# Patient Record
Sex: Male | Born: 1965 | Race: Black or African American | Hispanic: No | Marital: Single | Smoking: Current every day smoker
Health system: Southern US, Community
[De-identification: ages and names within clinical notes are randomized; demographics above are authoritative.]

## PROBLEM LIST (undated history)

## (undated) DIAGNOSIS — W3400XA Accidental discharge from unspecified firearms or gun, initial encounter: Secondary | ICD-10-CM

## (undated) DIAGNOSIS — K7400 Hepatic fibrosis, unspecified: Secondary | ICD-10-CM

## (undated) DIAGNOSIS — M1990 Unspecified osteoarthritis, unspecified site: Secondary | ICD-10-CM

## (undated) DIAGNOSIS — F32A Depression, unspecified: Secondary | ICD-10-CM

## (undated) DIAGNOSIS — B1920 Unspecified viral hepatitis C without hepatic coma: Secondary | ICD-10-CM

## (undated) DIAGNOSIS — D649 Anemia, unspecified: Secondary | ICD-10-CM

## (undated) HISTORY — DX: Hepatic fibrosis, unspecified: K74.00

## (undated) HISTORY — DX: Anemia, unspecified: D64.9

## (undated) HISTORY — DX: Depression, unspecified: F32.A

## (undated) HISTORY — DX: Unspecified viral hepatitis C without hepatic coma: B19.20

---
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
## 2001-12-13 HISTORY — PX: WISDOM TOOTH EXTRACTION: SHX21

## 2002-12-13 DIAGNOSIS — W3400XA Accidental discharge from unspecified firearms or gun, initial encounter: Secondary | ICD-10-CM

## 2002-12-13 DIAGNOSIS — Z5189 Encounter for other specified aftercare: Secondary | ICD-10-CM

## 2002-12-13 HISTORY — DX: Encounter for other specified aftercare: Z51.89

## 2002-12-13 HISTORY — DX: Accidental discharge from unspecified firearms or gun, initial encounter: W34.00XA

## 2002-12-13 HISTORY — PX: LEG SURGERY: SHX1003

## 2003-08-22 ENCOUNTER — Encounter: Admission: RE | Admit: 2003-08-22 | Discharge: 2003-11-20 | Payer: Self-pay

## 2003-08-23 ENCOUNTER — Encounter: Payer: Self-pay | Admitting: Emergency Medicine

## 2003-08-23 ENCOUNTER — Emergency Department (HOSPITAL_COMMUNITY): Admission: EM | Admit: 2003-08-23 | Discharge: 2003-08-23 | Payer: Self-pay | Admitting: Emergency Medicine

## 2005-06-15 ENCOUNTER — Emergency Department (HOSPITAL_COMMUNITY): Admission: EM | Admit: 2005-06-15 | Discharge: 2005-06-15 | Payer: Self-pay | Admitting: Emergency Medicine

## 2015-03-18 ENCOUNTER — Encounter (HOSPITAL_COMMUNITY): Payer: Self-pay | Admitting: Emergency Medicine

## 2015-03-18 ENCOUNTER — Emergency Department (HOSPITAL_COMMUNITY)
Admission: EM | Admit: 2015-03-18 | Discharge: 2015-03-18 | Disposition: A | Discharge status: Home / Self Care | Payer: Self-pay | Attending: Emergency Medicine | Admitting: Emergency Medicine

## 2015-03-18 DIAGNOSIS — Z720 Tobacco use: Secondary | ICD-10-CM | POA: Insufficient documentation

## 2015-03-18 DIAGNOSIS — Z880 Allergy status to penicillin: Secondary | ICD-10-CM | POA: Insufficient documentation

## 2015-03-18 DIAGNOSIS — M65051 Abscess of tendon sheath, right thigh: Secondary | ICD-10-CM | POA: Insufficient documentation

## 2015-03-18 DIAGNOSIS — L02415 Cutaneous abscess of right lower limb: Secondary | ICD-10-CM

## 2015-03-18 MED ORDER — SULFAMETHOXAZOLE-TRIMETHOPRIM 800-160 MG PO TABS: 1.0000 | ORAL_TABLET | Freq: Two times a day (BID) | ORAL | Status: AC

## 2015-03-18 NOTE — ED Provider Notes (Signed)
CSN: [id number]     Arrival date & time 03/18/15  0939 History  This chart was scribed for non-physician practitioner, Lucien Mons, PA-C working with [place], DO by Frederich Balding, ED scribe. This patient was seen in room TR11C/TR11C and the patient's care was started at 10:01 AM.   Chief Complaint  Patient presents with  . Abscess   The history is provided by the patient. No language interpreter was used.    HPI Comments: Henry Cunningham is a 49 y.o. male who presents to the Emergency Department complaining of an abscess to his right hip that started 2 weeks ago. He has used warm compresses and states it started draining 6 days ago. Pt reports increased pain around the area with pressure. He denies fever.   History reviewed. No pertinent past medical history. History reviewed. No pertinent past surgical history. No family history on file. History  Substance Use Topics  . Smoking status: Current Every Day Smoker -- 0.50 packs/day    Types: Cigarettes  . Smokeless tobacco: Not on file  . Alcohol Use: Yes     Comment: socially    Review of Systems  Constitutional: Negative for fever.  Skin: Positive for wound.  All other systems reviewed and are negative.  Allergies  Penicillins  Home Medications   Prior to Admission medications   Medication Sig Start Date End Date Taking? Authorizing Provider  sulfamethoxazole-trimethoprim (BACTRIM DS,SEPTRA DS) 800-160 MG per tablet Take 1 tablet by mouth 2 (two) times daily. 03/18/15 03/25/15  Celenia Hruska M Tag Wurtz, PA-C   BP 129/90 mmHg  Pulse 71  Temp(Src) 97.9 F (36.6 C) (Oral)  Resp 18  Ht 5\' 9"  (1.753 m)  Wt 189 lb (85.73 kg)  BMI 27.90 kg/m2  SpO2 97%   Physical Exam  Constitutional: He is oriented to person, place, and time. He appears well-developed and well-nourished. No distress.  HENT:  Head: Normocephalic and atraumatic.  Eyes: Conjunctivae and EOM are normal.  Neck: Normal range of motion. Neck supple.  Cardiovascular:  Normal rate, regular rhythm and normal heart sounds.   Pulmonary/Chest: Effort normal and breath sounds normal.  Musculoskeletal: Normal range of motion. He exhibits no edema.       Legs: Neurological: He is alert and oriented to person, place, and time.  Skin: Skin is warm and dry.  Psychiatric: He has a normal mood and affect. His behavior is normal.  Nursing note and vitals reviewed.   ED Course  Procedures (including critical care time)  DIAGNOSTIC STUDIES: Oxygen Saturation is 97% on RA, normal by my interpretation.    COORDINATION OF CARE: 10:02 AM-Discussed treatment plan which includes oral and topical antibitoics with pt at bedside and pt agreed to plan. Advised pt to continue warm compresses and follow up with primary care. Return precautions given.   Labs Review Labs Reviewed - No data to display  Imaging Review No results found.   EKG Interpretation None      MDM   Final diagnoses:  Abscess of right thigh   Nontoxic appearing, NAD. VSS. Abscess appears infected, very open. Advised continued warm compresses. Will start pt on Bactrim as this appears secondarily infected. Resources given for PCP establishment. Advised follow-up in urgent care in 2 days. Stable for discharge. Return precautions given. Patient states understanding of treatment care plan and is agreeable.   I personally performed the services described in this documentation, which was scribed in my presence. The recorded information has been reviewed and is  accurate.  Carman Ching, PA-C 03/18/15 [place], DO 03/18/15 1036

## 2015-03-18 NOTE — ED Notes (Signed)
Patient states he had a large "blister" come up about 2 weeks ago.  Patient states it "popped 1 week ago with a lot of pus".  Patient states still draining and now open.

## 2015-03-18 NOTE — Discharge Instructions (Signed)
Take Bactrim twice daily for 1 week. It is important for you to complete the entire course of this antibiotic. Apply warm compresses intermittently throughout the day. Follow-up with the wellness clinic or urgent care center in 2 days for wound recheck.  Abscess An abscess is an infected area that contains a collection of pus and debris.It can occur in almost any part of the body. An abscess is also known as a furuncle or boil. CAUSES  An abscess occurs when tissue gets infected. This can occur from blockage of oil or sweat glands, infection of hair follicles, or a minor injury to the skin. As the body tries to fight the infection, pus collects in the area and creates pressure under the skin. This pressure causes pain. People with weakened immune systems have difficulty fighting infections and get certain abscesses more often.  SYMPTOMS Usually an abscess develops on the skin and becomes a painful mass that is red, warm, and tender. If the abscess forms under the skin, you may feel a moveable soft area under the skin. Some abscesses break open (rupture) on their own, but most will continue to get worse without care. The infection can spread deeper into the body and eventually into the bloodstream, causing you to feel ill.  DIAGNOSIS  Your caregiver will take your medical history and perform a physical exam. A sample of fluid may also be taken from the abscess to determine what is causing your infection. TREATMENT  Your caregiver may prescribe antibiotic medicines to fight the infection. However, taking antibiotics alone usually does not cure an abscess. Your caregiver may need to make a small cut (incision) in the abscess to drain the pus. In some cases, gauze is packed into the abscess to reduce pain and to continue draining the area. HOME CARE INSTRUCTIONS   Only take over-the-counter or prescription medicines for pain, discomfort, or fever as directed by your caregiver.  If you were prescribed  antibiotics, take them as directed. Finish them even if you start to feel better.  If gauze is used, follow your caregiver's directions for changing the gauze.  To avoid spreading the infection:  Keep your draining abscess covered with a bandage.  Wash your hands well.  Do not share personal care items, towels, or whirlpools with others.  Avoid skin contact with others.  Keep your skin and clothes clean around the abscess.  Keep all follow-up appointments as directed by your caregiver. SEEK MEDICAL CARE IF:   You have increased pain, swelling, redness, fluid drainage, or bleeding.  You have muscle aches, chills, or a general ill feeling.  You have a fever. MAKE SURE YOU:   Understand these instructions.  Will watch your condition.  Will get help right away if you are not doing well or get worse. Document Released: 09/08/2005 Document Revised: 05/30/2012 Document Reviewed: 02/11/2012 [facility] Patient Information 2015 [place], [place]. This information is not intended to replace advice given to you by your health care provider. Make sure you discuss any questions you have with your health care provider.

## 2016-03-28 ENCOUNTER — Encounter (HOSPITAL_COMMUNITY): Payer: Self-pay | Admitting: *Deleted

## 2016-03-28 ENCOUNTER — Emergency Department (HOSPITAL_COMMUNITY)
Admission: EM | Admit: 2016-03-28 | Discharge: 2016-03-28 | Disposition: A | Discharge status: Home / Self Care | Payer: Self-pay | Attending: Emergency Medicine | Admitting: Emergency Medicine

## 2016-03-28 DIAGNOSIS — Z8739 Personal history of other diseases of the musculoskeletal system and connective tissue: Secondary | ICD-10-CM | POA: Insufficient documentation

## 2016-03-28 DIAGNOSIS — L0291 Cutaneous abscess, unspecified: Secondary | ICD-10-CM

## 2016-03-28 DIAGNOSIS — Z880 Allergy status to penicillin: Secondary | ICD-10-CM | POA: Insufficient documentation

## 2016-03-28 DIAGNOSIS — F17210 Nicotine dependence, cigarettes, uncomplicated: Secondary | ICD-10-CM | POA: Insufficient documentation

## 2016-03-28 DIAGNOSIS — Z87828 Personal history of other (healed) physical injury and trauma: Secondary | ICD-10-CM | POA: Insufficient documentation

## 2016-03-28 DIAGNOSIS — L02414 Cutaneous abscess of left upper limb: Secondary | ICD-10-CM | POA: Insufficient documentation

## 2016-03-28 HISTORY — DX: Unspecified osteoarthritis, unspecified site: M19.90

## 2016-03-28 HISTORY — DX: Accidental discharge from unspecified firearms or gun, initial encounter: W34.00XA

## 2016-03-28 MED ORDER — SULFAMETHOXAZOLE-TRIMETHOPRIM 800-160 MG PO TABS: 1.0000 | ORAL_TABLET | Freq: Two times a day (BID) | ORAL | Status: AC

## 2016-03-28 NOTE — ED Notes (Signed)
PT has a 3cm crusty wound with drainage to posterior aspect of Lt arm near elbow.

## 2016-03-28 NOTE — Discharge Instructions (Signed)

## 2016-03-28 NOTE — ED Notes (Signed)
Declined W/C at D/C and was escorted to lobby by RN. 
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
## 2017-04-27 ENCOUNTER — Encounter: Payer: Self-pay | Admitting: Internal Medicine

## 2017-06-01 ENCOUNTER — Encounter: Payer: Self-pay | Admitting: Internal Medicine

## 2017-06-01 ENCOUNTER — Ambulatory Visit (INDEPENDENT_AMBULATORY_CARE_PROVIDER_SITE_OTHER): Payer: Self-pay | Admitting: Internal Medicine

## 2017-06-01 ENCOUNTER — Telehealth: Payer: Self-pay | Admitting: *Deleted

## 2017-06-01 ENCOUNTER — Ambulatory Visit (INDEPENDENT_AMBULATORY_CARE_PROVIDER_SITE_OTHER): Payer: Self-pay | Admitting: Licensed Clinical Social Worker

## 2017-06-01 VITALS — BP 117/79 | HR 66 | Temp 97.6°F | Ht 68.5 in | Wt 165.0 lb

## 2017-06-01 DIAGNOSIS — Z23 Encounter for immunization: Secondary | ICD-10-CM

## 2017-06-01 DIAGNOSIS — F321 Major depressive disorder, single episode, moderate: Secondary | ICD-10-CM

## 2017-06-01 DIAGNOSIS — B182 Chronic viral hepatitis C: Secondary | ICD-10-CM | POA: Insufficient documentation

## 2017-06-01 LAB — CBC WITH DIFFERENTIAL/PLATELET
Basophils Absolute: 0 cells/uL (ref 0–200)
Basophils Relative: 0 %
EOS ABS: 200 {cells}/uL (ref 15–500)
Eosinophils Relative: 4 %
HEMATOCRIT: 46.9 % (ref 38.5–50.0)
HEMOGLOBIN: 15.3 g/dL (ref 13.2–17.1)
LYMPHS ABS: 1500 {cells}/uL (ref 850–3900)
Lymphocytes Relative: 30 %
MCH: 30.8 pg (ref 27.0–33.0)
MCHC: 32.6 g/dL (ref 32.0–36.0)
MCV: 94.4 fL (ref 80.0–100.0)
MONO ABS: 300 {cells}/uL (ref 200–950)
MPV: 11.1 fL (ref 7.5–12.5)
Monocytes Relative: 6 %
NEUTROS PCT: 60 %
Neutro Abs: 3000 cells/uL (ref 1500–7800)
Platelets: 199 10*3/uL (ref 140–400)
RBC: 4.97 MIL/uL (ref 4.20–5.80)
RDW: 13.5 % (ref 11.0–15.0)
WBC: 5 10*3/uL (ref 3.8–10.8)

## 2017-06-01 LAB — HEPATITIS B CORE ANTIBODY, TOTAL: Hep B Core Total Ab: NONREACTIVE

## 2017-06-01 LAB — COMPLETE METABOLIC PANEL WITH GFR
ALBUMIN: 4 g/dL (ref 3.6–5.1)
ALT: 40 U/L (ref 9–46)
AST: 32 U/L (ref 10–35)
Alkaline Phosphatase: 88 U/L (ref 40–115)
BUN: 13 mg/dL (ref 7–25)
CHLORIDE: 105 mmol/L (ref 98–110)
CO2: 27 mmol/L (ref 20–31)
Calcium: 8.9 mg/dL (ref 8.6–10.3)
Creat: 1.16 mg/dL (ref 0.70–1.33)
GFR, EST AFRICAN AMERICAN: 84 mL/min (ref >=60)
GFR, EST NON AFRICAN AMERICAN: 73 mL/min (ref >=60)
GLUCOSE: 87 mg/dL (ref 65–99)
Potassium: 4 mmol/L (ref 3.5–5.3)
Sodium: 139 mmol/L (ref 135–146)
TOTAL PROTEIN: 7.4 g/dL (ref 6.1–8.1)
Total Bilirubin: 0.6 mg/dL (ref 0.2–1.2)

## 2017-06-01 LAB — PROTIME-INR
INR: 1
Prothrombin Time: 11.1 s (ref 9.0–11.5)

## 2017-06-01 LAB — HEPATITIS B SURFACE ANTIGEN: Hepatitis B Surface Ag: NEGATIVE

## 2017-06-01 LAB — HEPATITIS B SURFACE ANTIBODY,QUALITATIVE: Hep B S Ab: NEGATIVE

## 2017-06-01 LAB — HEPATITIS A ANTIBODY, TOTAL: HEP A TOTAL AB: NONREACTIVE

## 2017-06-01 LAB — HIV ANTIBODY (ROUTINE TESTING W REFLEX): HIV 1&2 Ab, 4th Generation: NONREACTIVE

## 2017-06-01 MED ORDER — SOFOSBUVIR-VELPATASVIR 400-100 MG PO TABS: 1.0000 | ORAL_TABLET | Freq: Every day | ORAL | 2 refills | Status: DC

## 2017-06-01 NOTE — Addendum Note (Signed)
Addended by: Myrtis Hopping A on: 06/01/2017 10:26 AM   Modules accepted: Orders

## 2017-06-01 NOTE — Patient Instructions (Signed)
Date 06/01/17  Dear Mr. Tankard, As discussed in the [facility], your hepatitis C therapy will include highly effective medication(s) for treatment and will vary based on the type of hepatitis C and insurance approval.  Potential medications include:          Harvoni (sofosbuvir 90mg /ledipasvir 400mg ) tablet oral daily          OR     Epclusa (sofosbuvir 400mg /velpatasvir 100mg ) tablet oral daily          OR      Mavyret (glecaprevir 100 mg/pibrentasvir 40 mg): Take 3 tablets oral daily              Medications are typically for 8 or 12 weeks total ---------------------------------------------------------------- Your HCV Treatment Start Date: You will be notified by our office once the medication is approved and where you can pick it up (or if mailed)   ---------------------------------------------------------------- [place]:   [facility] [place], [place] [address] Phone: [phone] Hours: Monday to Friday 7:30 am to 6:00 pm   Please always contact your pharmacy at least 3-4 business days before you run out of medications to ensure your next month's medication is ready or 1 week prior to running out if you receive it by mail.  Remember, each prescription is for 28 days. ---------------------------------------------------------------- GENERAL NOTES REGARDING YOUR HEPATITIS C MEDICATION:  Some medications have the following interactions:  - Acid reducing agents such as H2 blockers (ie. Pepcid (famotidine), Zantac (ranitidine), Tagamet (cimetidine), Axid (nizatidine) and proton pump inhibitors (ie. Prilosec (omeprazole), Protonix (pantoprazole), Nexium (esomeprazole), or Aciphex (rabeprazole)). Do not take until you have discussed with a health care provider.    -Antacids that contain magnesium and/or aluminum hydroxide (ie. Milk of Magensia, Rolaids, Gaviscon, Maalox, Mylanta, an dArthritis Pain Formula).  -Calcium carbonate (calcium  supplements or antacids such as Tums, Caltrate, Os-Cal).  -St. John's wort or any products that contain St. John's wort like some herbal supplements  Please inform the office prior to starting any of these medications.  - The common side effects associated with Harvoni include:      1. Fatigue      2. Headache      3. Nausea      4. Diarrhea      5. Insomnia  Please note that this only lists the most common side effects and is NOT a comprehensive list of the potential side effects of these medications. For more information, please review the drug information sheets that come with your medication package from the pharmacy.  ---------------------------------------------------------------- GENERAL HELPFUL HINTS ON HCV THERAPY: 1. Stay well-hydrated. 2. Notify the [facility] of any changes in your other over-the-counter/herbal or prescription medications. 3. If you miss a dose of your medication, take the missed dose as soon as you remember. Return to your regular time/dose schedule the next day.  4.  Do not stop taking your medications without first talking with your healthcare provider. 5.  You may take Tylenol (acetaminophen), as long as the dose is less than 2000 mg (OR no more than 4 tablets of the Tylenol Extra Strengths 500mg  tablet) in 24 hours. 6.  You will see our pharmacist-specialist within the first 2 weeks of starting your medication to monitor for any possible side effects. 7.  You will have labs once during treatment, soon after treatment completion and one final lab 6 months after treatment completion to verify the virus is out of your system.  Elmer Merwin, [place],  [place] [facility] [place] [place], [place]  [id number] [phone]

## 2017-06-01 NOTE — Telephone Encounter (Signed)
error 
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
## 2017-06-01 NOTE — Progress Notes (Signed)
Integrated Behavioral Health Initial Visit  MRN: [id number] Name: Henry Cunningham   Session Start time: 10:30 am Session End time: 10:45 am Total time: 15 minutes  Type of Service: [place] Interpretor:No. Interpretor Name and Language: N/A   Warm Hand Off Completed.       SUBJECTIVE: Henry Cunningham is a 51 y.o. male accompanied by patient. Patient was referred by Dr. Linus Salmons for high PHQ-9 scores.  Patient reports the following symptoms/concerns: See PHQ-9 results below Severity of problem: moderate  OBJECTIVE: Mood: Depressed and Affect: Appropriate Risk of harm to self or others: No plan to harm self or others.  Patient stated, "I love myself too much".  Patient also denied past suicide attempt.   LIFE CONTEXT: Patient is seeking assistance with employment, housing and transportation.  GOALS ADDRESSED: Patient will reduce symptoms of: depression and increase knowledge and/or ability of: coping skills and also: Increase healthy adjustment to current life circumstances and Increase adequate support systems for patient/family   INTERVENTIONS: Supportive Counseling and Link to [employer]  Standardized Assessments completed: PHQ 9 Score is 13, which is within the range 10-14 moderately depressed  Depression screen PHQ 2/9 06/01/2017  Decreased Interest 2  Down, Depressed, Hopeless 2  PHQ - 2 Score 4  Altered sleeping 2  Tired, decreased energy 2  Change in appetite 2  Feeling bad or failure about yourself  2  Trouble concentrating 0  Moving slowly or fidgety/restless 1  Suicidal thoughts 0  PHQ-9 Score 13  Difficult doing work/chores Very difficult    ASSESSMENT: Patient currently experiencing depressive symptoms and may benefit from local mental health services.  Patient reported that he has history of receiving mental health services from [facility] but walked away from treatment.  Patient was agreeable with  returning there for treatment and stated that he would walk-in for assessment either this afternoon or tomorrow morning.  PLAN: 1. Referral(s): [employer] forces logistics/support/administrative officer (LME/Outside Clinic) and [employer]:  Food and Housing 2. Patient will return to [facility] today or tomorrow for assessment and to receive mental health services and medication management.  Henry Cunningham, [facility]

## 2017-06-02 ENCOUNTER — Telehealth: Payer: Self-pay | Admitting: *Deleted

## 2017-06-02 NOTE — Telephone Encounter (Signed)
Patient's emergency contact, mother left a voice mail to inform him of appt for ultrasound on 06/23/17 at 10:45 AM at [facility]. Nothing to eat or drink after midnight. Patient stated he is currently home less and his voice mail is not set up on his phone. Myrtis Hopping

## 2017-06-04 LAB — LIVER FIBROSIS, FIBROTEST-ACTITEST
ALPHA-2-MACROGLOBULIN: 197 mg/dL (ref 106–279)
ALT: 37 U/L (ref 9–46)
Apolipoprotein A1: 143 mg/dL (ref 94–176)
Bilirubin: 0.5 mg/dL (ref 0.2–1.2)
FIBROSIS SCORE: 0.24
GGT: 38 U/L (ref 3–95)
HAPTOGLOBIN: 145 mg/dL (ref 43–212)
Necroinflammat ACT Score: 0.18
Reference ID: 2001552

## 2017-06-06 LAB — HCV RNA, QN PCR RFLX GENO, LIPA
HCV RNA, PCR, QN: 778000 [IU]/mL — AB
HCV RNA, PCR, QN: 5.89 log IU/mL — ABNORMAL HIGH

## 2017-06-06 LAB — HEPATITIS C GENOTYPE: HCV Genotype: 3

## 2017-06-23 ENCOUNTER — Ambulatory Visit (HOSPITAL_COMMUNITY): Payer: Self-pay

## 2017-07-07 ENCOUNTER — Ambulatory Visit (INDEPENDENT_AMBULATORY_CARE_PROVIDER_SITE_OTHER): Payer: Self-pay | Admitting: Pharmacist Clinician (PhC)/ Clinical Pharmacy Specialist

## 2017-07-07 ENCOUNTER — Encounter: Payer: Self-pay | Admitting: Pharmacy Technician

## 2017-07-07 DIAGNOSIS — B182 Chronic viral hepatitis C: Secondary | ICD-10-CM
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
## 2017-07-28 ENCOUNTER — Ambulatory Visit: Payer: Self-pay

## 2017-09-02 ENCOUNTER — Encounter (HOSPITAL_COMMUNITY): Payer: Self-pay

## 2017-09-02 ENCOUNTER — Emergency Department (HOSPITAL_COMMUNITY)
Admission: EM | Admit: 2017-09-02 | Discharge: 2017-09-02 | Disposition: A | Discharge status: Home / Self Care | Payer: Self-pay | Attending: Emergency Medicine | Admitting: Emergency Medicine

## 2017-09-02 ENCOUNTER — Emergency Department (HOSPITAL_COMMUNITY): Payer: Self-pay

## 2017-09-02 DIAGNOSIS — M25552 Pain in left hip: Secondary | ICD-10-CM | POA: Insufficient documentation

## 2017-09-02 DIAGNOSIS — M25562 Pain in left knee: Secondary | ICD-10-CM | POA: Insufficient documentation

## 2017-09-02 DIAGNOSIS — F17210 Nicotine dependence, cigarettes, uncomplicated: Secondary | ICD-10-CM | POA: Insufficient documentation

## 2017-09-02 DIAGNOSIS — Z79899 Other long term (current) drug therapy: Secondary | ICD-10-CM | POA: Insufficient documentation

## 2017-09-02 MED ORDER — NAPROXEN 500 MG PO TABS: 500.0000 mg | ORAL_TABLET | Freq: Two times a day (BID) | ORAL | 0 refills | Status: DC | PRN

## 2017-09-02 MED ORDER — TRAMADOL HCL 50 MG PO TABS
50.0000 mg | ORAL_TABLET | Freq: Once | ORAL | Status: AC
  Administered 2017-09-02: 50 mg via ORAL
  Filled 2017-09-02: qty 1

## 2017-09-02 NOTE — Discharge Instructions (Signed)
Please take Naproxen (anti-inflammatory) as prescribed--take with food to prevent GI upset. Please follow up with your primary care doctor in 3-5 days regarding today's visit; also please follow up with Dr. Ninfa Linden (orthopedics) in 1 week regarding today's ER visit. Please return to the ER if you experience fevers, chills, unexplained weight loss, dizziness, chest pain, shortness of breath, abdominal pain, nausea, vomiting, diarrhea, bladder or bowel dysfunction, numbness to groin, swelling/redness/warmth to hip or knee, extremity numbness or tingling, extremity weakness, increased pain, worsening symptoms, or any additional concerns.

## 2017-09-02 NOTE — ED Triage Notes (Signed)
Pt presents to the ed for complaints of having pain in his left hip and knee that started this morning, denies any injury. Reports having really bad arthritis.
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
## 2017-09-08 ENCOUNTER — Ambulatory Visit (INDEPENDENT_AMBULATORY_CARE_PROVIDER_SITE_OTHER): Payer: Self-pay | Admitting: Pharmacist

## 2017-09-08 DIAGNOSIS — B182 Chronic viral hepatitis C: Secondary | ICD-10-CM
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
## 2017-09-10 LAB — HEPATITIS C RNA QUANTITATIVE
HCV QUANT LOG: 5.74 {Log_IU}/mL — AB
HCV RNA, PCR, QN: 554000 [IU]/mL — AB

## 2017-09-12 NOTE — Progress Notes (Signed)
He is clearly not taking it at all.

## 2017-09-12 NOTE — Progress Notes (Signed)
This guy is supposedly on his last month of Epclusa.Henry KitchenMarland Cunningham

## 2017-09-14 ENCOUNTER — Telehealth: Payer: Self-pay | Admitting: Pharmacist

## 2017-09-14 ENCOUNTER — Other Ambulatory Visit: Payer: Self-pay | Admitting: Pharmacist

## 2017-09-14 NOTE — Telephone Encounter (Signed)
Pt on Epclusa x 12 weeks for his Hep C infection.  He started around 7/26 and is on his last month.  He tells me he is adherent but when a viral load was checked, it was still 554,000 which is way high for him to be almost done with therapy.  I called him again and he said he only remembers missing 3 doses.  Will discuss with Dr. Linus Salmons.
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
## 2017-09-28 ENCOUNTER — Telehealth: Payer: Self-pay | Admitting: Pharmacist

## 2017-09-28 NOTE — Telephone Encounter (Signed)
I was finally able to get in touch with [place] concerning his Hep C.  He still states he has only missed 3-4 doses.  Dr. Linus Salmons would like for him to come and see him, so I scheduled him to come back on Tuesday October 23 at 11:15am.

## 2017-10-04 ENCOUNTER — Ambulatory Visit (INDEPENDENT_AMBULATORY_CARE_PROVIDER_SITE_OTHER): Payer: Self-pay | Admitting: Internal Medicine

## 2017-10-04 ENCOUNTER — Encounter: Payer: Self-pay | Admitting: Internal Medicine

## 2017-10-04 VITALS — BP 109/70 | HR 81 | Temp 98.4°F | Ht 68.0 in | Wt 173.0 lb

## 2017-10-04 DIAGNOSIS — B182 Chronic viral hepatitis C: Secondary | ICD-10-CM

## 2017-10-04 DIAGNOSIS — K7400 Hepatic fibrosis, unspecified: Secondary | ICD-10-CM

## 2017-10-04 NOTE — Assessment & Plan Note (Addendum)
He will finish the 3rd bottle if he finds it and we will hope for the best.  I will c heck his viral load today and he will come back with PharmD in 5 weeks for EOT lab.  25 minutes spent with the patient inlcuding 15 min face to face counseling on compliance

## 2017-10-04 NOTE — Progress Notes (Signed)
   Subjective:    Patient ID: Henry Cunningham, male    DOB: December 07, 1966, 51 y.o.   MRN: [id number]  HPI Here for follow up of HCV Has been on Epclusa and initially endorsed good compliance but with me admitted poor compliance.  He is finishing his second bottle now but was taking very sporadically before and his labs show that with essentially no suppression of the virus.  For 2 weeks he has been taking daily.  He is unsure if he has his third bottle supply but will check.  It is not here so hopefully he has it.  He does feel less fatigue.     Review of Systems  Constitutional: Negative for fatigue.  Skin: Negative for rash.  Neurological: Negative for dizziness.       Objective:   Physical Exam  Constitutional: He appears well-developed and well-nourished. No distress.  Eyes: No scleral icterus.  Cardiovascular: Normal rate, regular rhythm and normal heart sounds.   No murmur heard. Pulmonary/Chest: Effort normal and breath sounds normal. No respiratory distress.  Lymphadenopathy:    He has no cervical adenopathy.  Skin: No rash noted.   SH: no alcohol       Assessment & Plan:

## 2017-10-04 NOTE — Assessment & Plan Note (Signed)
Minimal on fibrosure.  Will not need [place] screening

## 2017-10-06 LAB — HEPATITIS C RNA QUANTITATIVE
HCV QUANT LOG: NOT DETECTED {Log_IU}/mL
HCV RNA, PCR, QN: NOT DETECTED [IU]/mL
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
## 2017-10-25 ENCOUNTER — Ambulatory Visit (HOSPITAL_COMMUNITY)
Admission: RE | Admit: 2017-10-25 | Discharge: 2017-10-25 | Disposition: A | Discharge status: Home / Self Care | Payer: Self-pay | Source: Ambulatory Visit | Attending: Internal Medicine | Admitting: Internal Medicine

## 2017-10-25 DIAGNOSIS — B182 Chronic viral hepatitis C: Secondary | ICD-10-CM | POA: Insufficient documentation

## 2017-10-25 DIAGNOSIS — N281 Cyst of kidney, acquired: Secondary | ICD-10-CM | POA: Insufficient documentation

## 2017-10-25 DIAGNOSIS — K8020 Calculus of gallbladder without cholecystitis without obstruction: Secondary | ICD-10-CM | POA: Insufficient documentation

## 2017-10-31 ENCOUNTER — Telehealth: Payer: Self-pay | Admitting: *Deleted

## 2017-10-31 NOTE — Telephone Encounter (Signed)
Patient requested his most recent ultrasound report be faxed to his disability lawyer. RN confirmed patient identity using name, date of birth, last 4 digits of social security number. THere is a signed release from his disability firm on file that expires 12/08/17. RN confirmed fax number, sent elastography/ultrasound via [employer] fax. Landis Gandy, RN

## 2017-11-08 ENCOUNTER — Ambulatory Visit: Payer: Self-pay | Admitting: Internal Medicine

## 2017-11-14 ENCOUNTER — Ambulatory Visit: Payer: Self-pay
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
## 2018-01-16 ENCOUNTER — Ambulatory Visit: Payer: Self-pay

## 2018-02-18 ENCOUNTER — Emergency Department (HOSPITAL_COMMUNITY)
Admission: EM | Admit: 2018-02-18 | Discharge: 2018-02-18 | Disposition: A | Discharge status: Home / Self Care | Payer: Medicaid Other | Attending: Emergency Medicine | Admitting: Emergency Medicine

## 2018-02-18 ENCOUNTER — Encounter (HOSPITAL_COMMUNITY): Payer: Self-pay

## 2018-02-18 ENCOUNTER — Emergency Department (HOSPITAL_COMMUNITY): Payer: Medicaid Other

## 2018-02-18 DIAGNOSIS — M25562 Pain in left knee: Secondary | ICD-10-CM | POA: Insufficient documentation

## 2018-02-18 DIAGNOSIS — F17210 Nicotine dependence, cigarettes, uncomplicated: Secondary | ICD-10-CM | POA: Diagnosis not present

## 2018-02-18 DIAGNOSIS — M25552 Pain in left hip: Secondary | ICD-10-CM | POA: Insufficient documentation

## 2018-02-18 MED ORDER — OXYCODONE-ACETAMINOPHEN 5-325 MG PO TABS
1.0000 | ORAL_TABLET | Freq: Once | ORAL | Status: AC
  Administered 2018-02-18: 1 via ORAL
  Filled 2018-02-18: qty 1

## 2018-02-18 MED ORDER — OXYCODONE-ACETAMINOPHEN 5-325 MG PO TABS: 1.0000 | ORAL_TABLET | Freq: Three times a day (TID) | ORAL | 0 refills | Status: DC | PRN

## 2018-02-18 MED ORDER — IBUPROFEN 800 MG PO TABS: 800.0000 mg | ORAL_TABLET | Freq: Three times a day (TID) | ORAL | 0 refills | Status: DC | PRN
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
## 2018-02-18 NOTE — Discharge Instructions (Signed)
You were seen today in the ED with hip and leg pain. We are treating your symptoms with a short course of pain medication. Call the orthopedic doctor listed. Your x-ray showed evidence of severe arthritis and avascular necrosis of the left hip which is causing your chronic pain.

## 2018-02-18 NOTE — ED Triage Notes (Signed)
Pt arrives via POV with c/o chronic left knee pain and left hip due to arthritis; Pt states leg is giving out on him when he trys to walk; Pt states pain at 8/10 on arrival. Pt states he takes gabapentin and Motrin and a muscle relaxers. Pt a&ox 4 on arrival.-Monique,RN

## 2018-02-18 NOTE — ED Notes (Signed)
Patient transported to X-ray 
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
## 2018-03-04 IMAGING — DX DG HIP (WITH OR WITHOUT PELVIS) 2-3V*L*
1 series · 1 of 1 positions shown · non-contrast
Comparison: None.

CLINICAL DATA: Acute left hip pain without new injury.

EXAM:
DG HIP (WITH OR WITHOUT PELVIS) 2-3V LEFT

[knee ap]
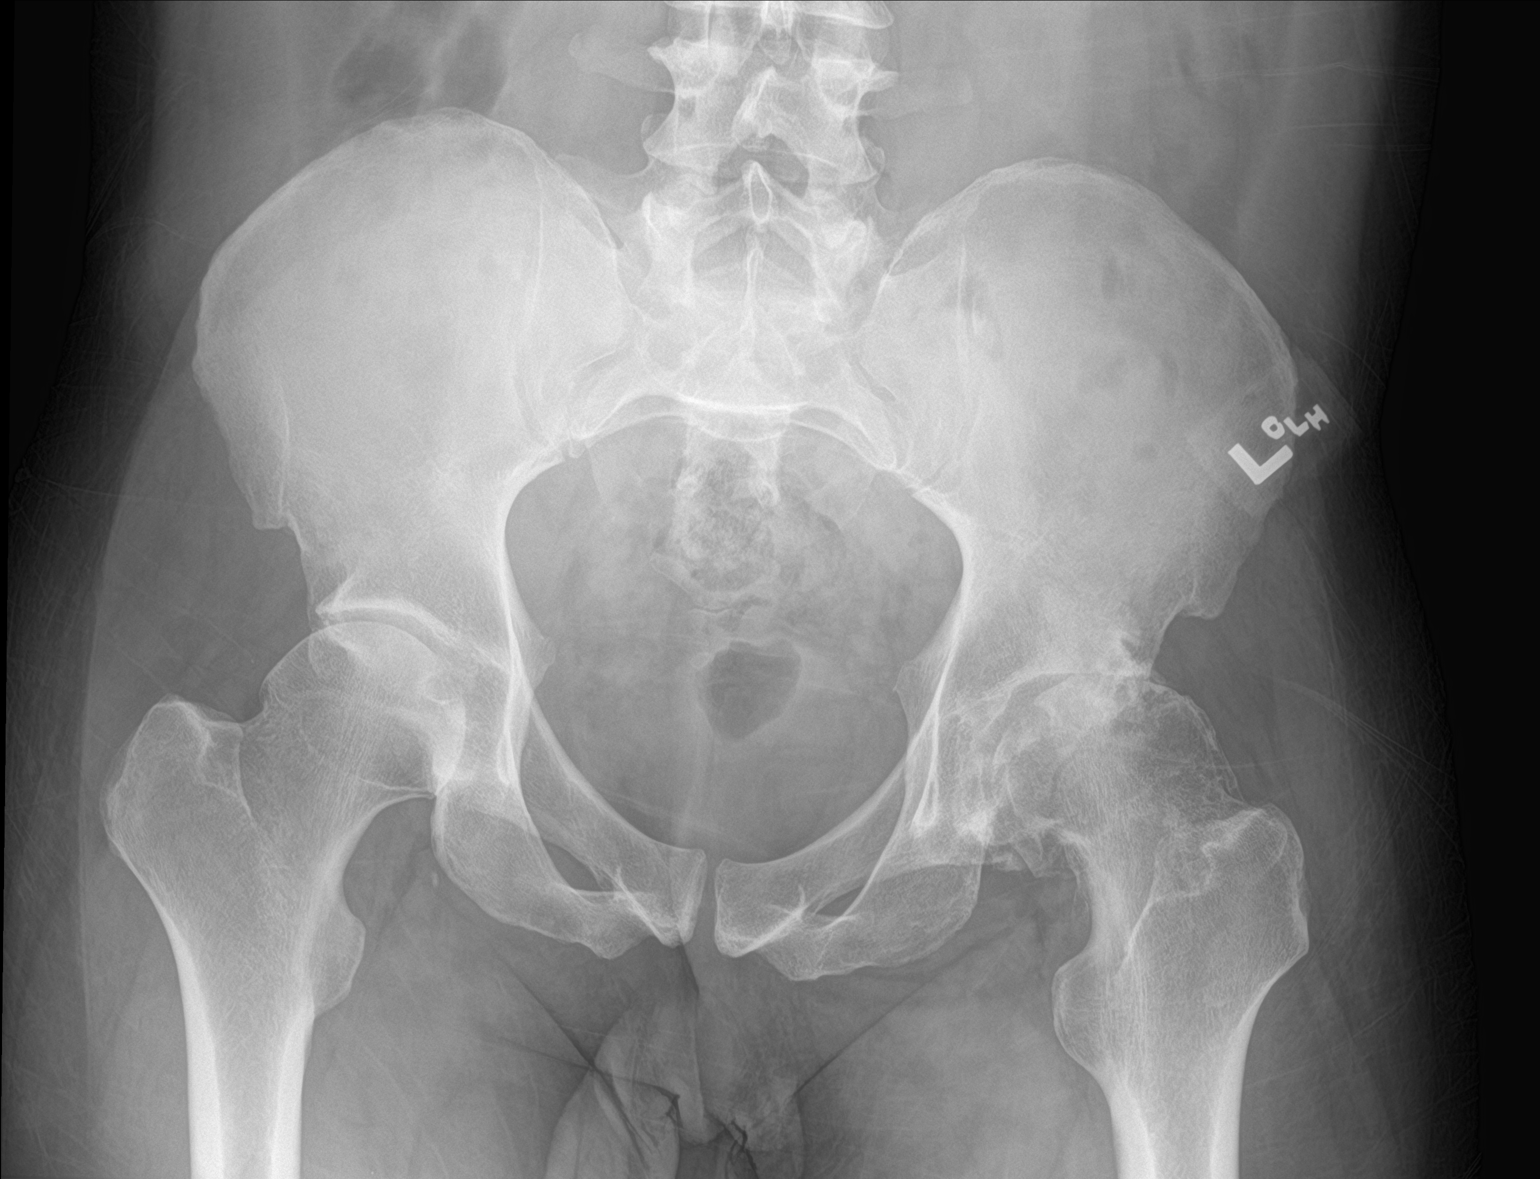

[1 of 1 positions shown; findings below may reference images not displayed]

FINDINGS: There is no evidence of hip fracture or dislocation. Severe
narrowing and osteophyte formation in superior orientation is seen
involving the left hip joint. Lucency and sclerosis is seen
involving the superior portion of the left femoral head suggesting
avascular necrosis.
IMPRESSION: Severe degenerative joint disease of the left hip is noted. Possible
avascular necrosis of left femoral head is noted.
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
## 2018-08-20 IMAGING — CR DG KNEE 1-2V*L*
2 series · 2 of 2 positions shown · non-contrast
Comparison: None.

CLINICAL DATA: Initial evaluation for acute leg pain status post
fall.

EXAM:
LEFT KNEE - 1-2 VIEW

[knee ap]
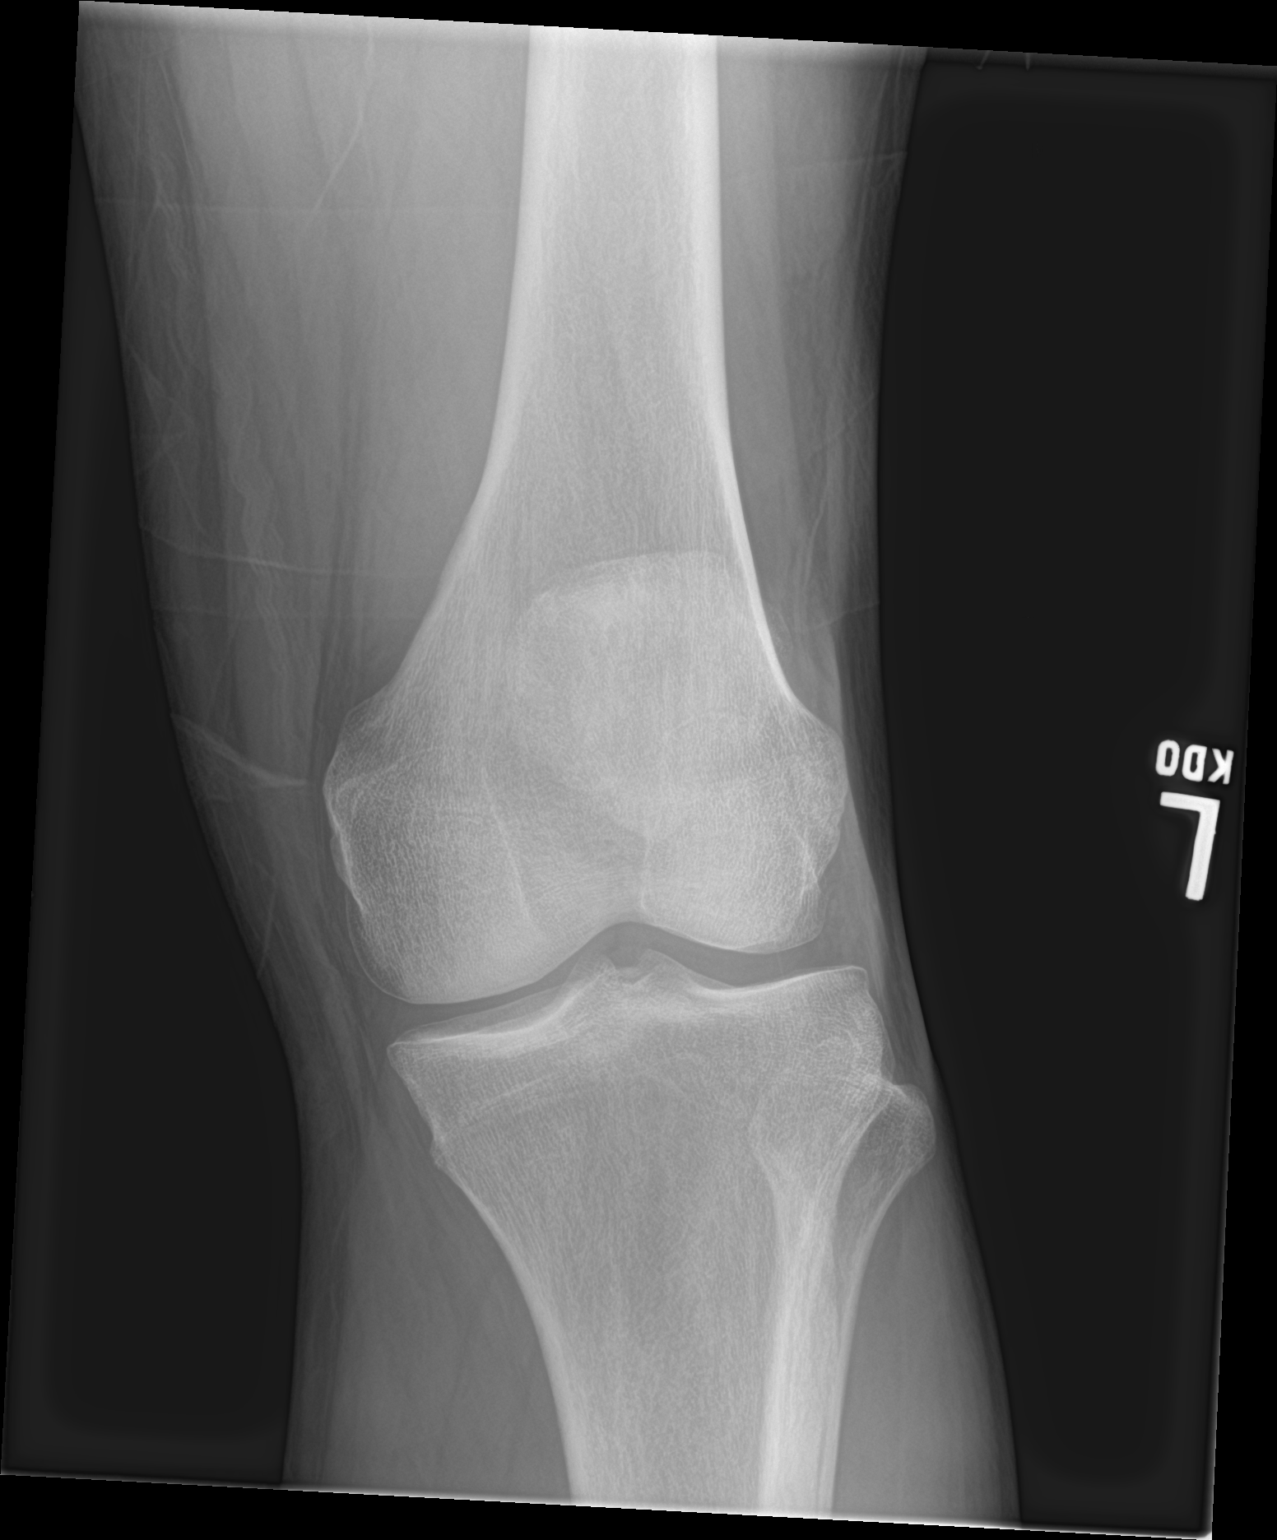

[knee lat]
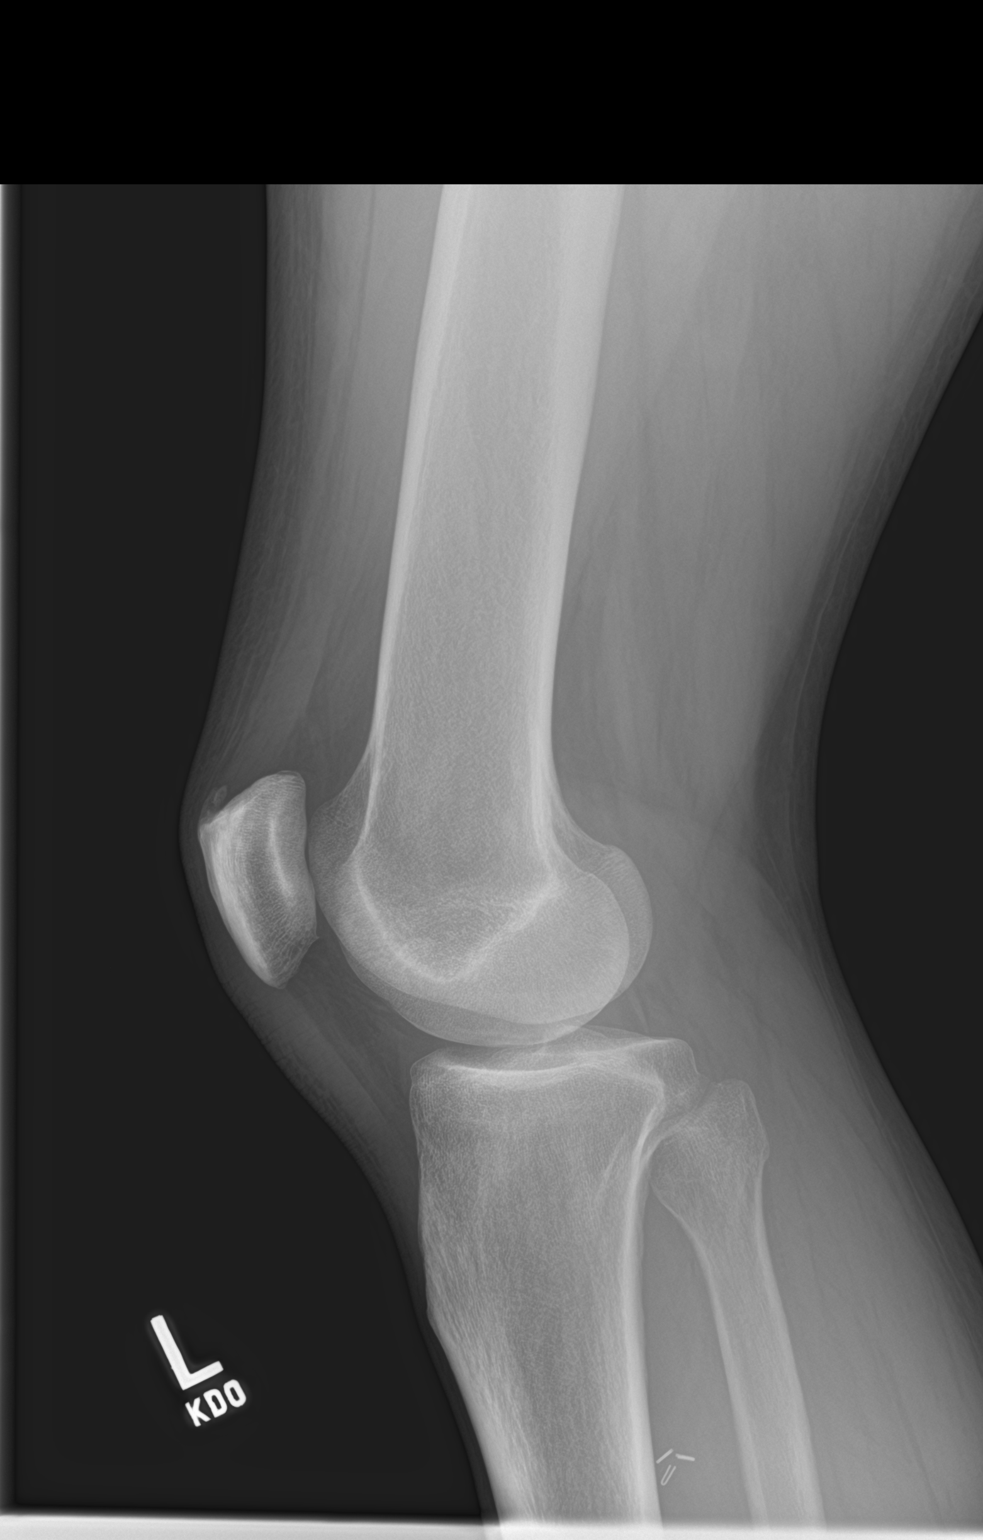

[2 of 2 positions shown; findings below may reference images not displayed]

FINDINGS: No evidence of fracture, dislocation, or joint effusion. No evidence
of arthropathy or other focal bone abnormality. Soft tissues
demonstrate no acute abnormality. Few screw trickle clips overlie
the proximal left leg.
IMPRESSION: No acute osseous abnormality about the left knee.

## 2018-11-16 NOTE — Congregational Nurse Program (Signed)
Client states he is homeless again.  Admits to being depressed and has been seen at [facility].  Encouraged client to keep his appointments.  Bus passes given to go to [employer] for [employer]

## 2019-02-12 ENCOUNTER — Encounter (INDEPENDENT_AMBULATORY_CARE_PROVIDER_SITE_OTHER): Payer: Self-pay | Admitting: Primary Care

## 2019-02-12 ENCOUNTER — Ambulatory Visit (INDEPENDENT_AMBULATORY_CARE_PROVIDER_SITE_OTHER): Payer: Medicaid Other | Admitting: Primary Care

## 2019-02-12 ENCOUNTER — Other Ambulatory Visit: Payer: Self-pay

## 2019-02-12 VITALS — BP 132/74 | HR 78 | Temp 97.8°F | Ht 68.0 in | Wt 184.2 lb

## 2019-02-12 DIAGNOSIS — M545 Low back pain: Secondary | ICD-10-CM | POA: Diagnosis not present

## 2019-02-12 DIAGNOSIS — M1990 Unspecified osteoarthritis, unspecified site: Secondary | ICD-10-CM | POA: Diagnosis not present

## 2019-02-12 DIAGNOSIS — B182 Chronic viral hepatitis C: Secondary | ICD-10-CM | POA: Diagnosis not present

## 2019-02-12 DIAGNOSIS — K029 Dental caries, unspecified: Secondary | ICD-10-CM

## 2019-02-12 DIAGNOSIS — G8929 Other chronic pain: Secondary | ICD-10-CM | POA: Diagnosis not present

## 2019-02-12 DIAGNOSIS — K047 Periapical abscess without sinus: Secondary | ICD-10-CM

## 2019-02-12 NOTE — Patient Instructions (Signed)
Place back pain patient instructions here.
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
## 2019-02-22 ENCOUNTER — Encounter (INDEPENDENT_AMBULATORY_CARE_PROVIDER_SITE_OTHER): Payer: Self-pay | Admitting: Surgery

## 2019-02-22 ENCOUNTER — Telehealth: Payer: Self-pay | Admitting: Behavioral Health

## 2019-02-22 ENCOUNTER — Other Ambulatory Visit: Payer: Self-pay

## 2019-02-22 ENCOUNTER — Ambulatory Visit (INDEPENDENT_AMBULATORY_CARE_PROVIDER_SITE_OTHER): Payer: Medicaid Other | Admitting: Surgery

## 2019-02-22 ENCOUNTER — Ambulatory Visit (INDEPENDENT_AMBULATORY_CARE_PROVIDER_SITE_OTHER): Payer: Medicaid Other

## 2019-02-22 DIAGNOSIS — M1612 Unilateral primary osteoarthritis, left hip: Secondary | ICD-10-CM

## 2019-02-22 DIAGNOSIS — Z720 Tobacco use: Secondary | ICD-10-CM

## 2019-02-22 DIAGNOSIS — G8929 Other chronic pain: Secondary | ICD-10-CM

## 2019-02-22 DIAGNOSIS — M25562 Pain in left knee: Secondary | ICD-10-CM | POA: Diagnosis not present

## 2019-02-22 NOTE — Telephone Encounter (Signed)
Patient came in today as a walk in to drop off a medical clearance form for L Total Hip Arthroplasty.  Patient has an upcoming appointment with Dr. Linus Salmons 02/27/2019 so he was informed to bring the form back so it can be filled out at his office visit.  Also made a copy to place in triage and a copy placed in Dr. Henreitta Leber box just in case he forgets to ring it back at his office visit. Pricilla Riffle RN
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
## 2019-02-23 LAB — CBC WITH DIFFERENTIAL/PLATELET
Absolute Monocytes: 437 cells/uL (ref 200–950)
Basophils Absolute: 41 cells/uL (ref 0–200)
Basophils Relative: 0.9 %
Eosinophils Absolute: 288 cells/uL (ref 15–500)
Eosinophils Relative: 6.4 %
HCT: 43.9 % (ref 38.5–50.0)
Hemoglobin: 15.2 g/dL (ref 13.2–17.1)
Lymphs Abs: 1607 cells/uL (ref 850–3900)
MCH: 31.5 pg (ref 27.0–33.0)
MCHC: 34.6 g/dL (ref 32.0–36.0)
MCV: 91.1 fL (ref 80.0–100.0)
MONOS PCT: 9.7 %
MPV: 12 fL (ref 7.5–12.5)
Neutro Abs: 2129 cells/uL (ref 1500–7800)
Neutrophils Relative %: 47.3 %
Platelets: 175 10*3/uL (ref 140–400)
RBC: 4.82 10*6/uL (ref 4.20–5.80)
RDW: 13.1 % (ref 11.0–15.0)
TOTAL LYMPHOCYTE: 35.7 %
WBC: 4.5 10*3/uL (ref 3.8–10.8)

## 2019-02-23 LAB — SEDIMENTATION RATE: Sed Rate: 22 mm/h — ABNORMAL HIGH (ref 0–20)

## 2019-02-23 LAB — URIC ACID: Uric Acid, Serum: 6 mg/dL (ref 4.0–8.0)

## 2019-02-23 LAB — RHEUMATOID FACTOR: Rheumatoid fact SerPl-aCnc: <14 IU/mL (ref <14)

## 2019-02-23 LAB — ANA: Anti Nuclear Antibody(ANA): NEGATIVE

## 2019-02-27 ENCOUNTER — Other Ambulatory Visit: Payer: Self-pay

## 2019-02-27 ENCOUNTER — Telehealth: Payer: Self-pay | Admitting: Pharmacy Technician

## 2019-02-27 ENCOUNTER — Ambulatory Visit (INDEPENDENT_AMBULATORY_CARE_PROVIDER_SITE_OTHER): Payer: Medicaid Other | Admitting: Internal Medicine

## 2019-02-27 ENCOUNTER — Encounter: Payer: Self-pay | Admitting: Internal Medicine

## 2019-02-27 VITALS — BP 135/85 | HR 70 | Temp 98.0°F | Wt 192.0 lb

## 2019-02-27 DIAGNOSIS — K7400 Hepatic fibrosis, unspecified: Secondary | ICD-10-CM

## 2019-02-27 DIAGNOSIS — M1990 Unspecified osteoarthritis, unspecified site: Secondary | ICD-10-CM

## 2019-02-27 DIAGNOSIS — B182 Chronic viral hepatitis C: Secondary | ICD-10-CM | POA: Diagnosis present

## 2019-02-27 NOTE — Assessment & Plan Note (Signed)
For consideration for hip replacement.  No contraindication from ID standpoint.  Clearance form faxed

## 2019-02-27 NOTE — Progress Notes (Signed)
   Subjective:    Patient ID: [name], male    DOB: [birth year], 53 y.o.   MRN: [id number]  HPI Here for follow up of chronic hepatitis C. He has genotype 3 and completed 12 weeks of Epclusa over 1 year ago.  Did not come back for follow up.  Here now though since Dr. Ninfa Linden of ortho requested ID clearance for his upcoming surgery.  No fatigue, no new associated symptoms.     Review of Systems  Constitutional: Negative for fatigue.  Gastrointestinal: Negative for diarrhea and nausea.  Skin: Negative for rash.       Objective:   Physical Exam Constitutional:      Appearance: Normal appearance.  HENT:     Mouth/Throat:     Pharynx: No posterior oropharyngeal erythema.  Cardiovascular:     Rate and Rhythm: Normal rate and regular rhythm.     Heart sounds: No murmur.  Pulmonary:     Effort: Pulmonary effort is normal.     Breath sounds: Normal breath sounds.  Skin:    Findings: No rash.  Neurological:     Mental Status: He is alert.   SH: no alcohol at all        Assessment & Plan:

## 2019-02-27 NOTE — Assessment & Plan Note (Signed)
Moderate fibrosis, no HCC screening indicated.  Encouraged continued

## 2019-02-27 NOTE — Assessment & Plan Note (Addendum)
Will check SVR24/test of cure today.   No follow up needed if negative  ADDENDUM: unfortunately, his HCV RNA is detected again so will need retreatment.  He will be notified.

## 2019-02-27 NOTE — Telephone Encounter (Signed)
[facility] Patient Teacher, English as a foreign language completed.    The patient in insured through [facility] Medicaid and her copay is $3.  We will continue.  Venida Jarvis. Nadara Mustard [place] Patient [facility] for Infectious Disease Phone: [phone] Fax:  [phone]

## 2019-03-01 LAB — COMPLETE METABOLIC PANEL WITH GFR
AG Ratio: 1.3 (calc) (ref 1.0–2.5)
ALT: 68 U/L — ABNORMAL HIGH (ref 9–46)
AST: 29 U/L (ref 10–35)
Albumin: 3.8 g/dL (ref 3.6–5.1)
Alkaline phosphatase (APISO): 93 U/L (ref 35–144)
BUN: 12 mg/dL (ref 7–25)
CO2: 28 mmol/L (ref 20–32)
Calcium: 9.2 mg/dL (ref 8.6–10.3)
Chloride: 106 mmol/L (ref 98–110)
Creat: 1.1 mg/dL (ref 0.70–1.33)
GFR, Est African American: 88 mL/min/{1.73_m2} (ref >=60)
GFR, Est Non African American: 76 mL/min/{1.73_m2} (ref >=60)
Globulin: 3 g/dL (calc) (ref 1.9–3.7)
Glucose, Bld: 94 mg/dL (ref 65–99)
Potassium: 4.3 mmol/L (ref 3.5–5.3)
Sodium: 140 mmol/L (ref 135–146)
Total Bilirubin: 0.2 mg/dL (ref 0.2–1.2)
Total Protein: 6.8 g/dL (ref 6.1–8.1)

## 2019-03-01 LAB — HEPATITIS C RNA QUANTITATIVE
HCV QUANT LOG: 4.84 {Log_IU}/mL — AB
HCV RNA, PCR, QN: 68800 IU/mL — ABNORMAL HIGH

## 2019-03-05 ENCOUNTER — Telehealth (INDEPENDENT_AMBULATORY_CARE_PROVIDER_SITE_OTHER): Payer: Self-pay

## 2019-03-05 NOTE — Telephone Encounter (Signed)
Called patient and asked the screening questions.  Do you have now or have you had in the past 7 days a fever and/or chills? NO  Do you have now or have you had in the past 7 days a cough? NO  Do you have now or have you had in the last 7 days nausea, vomiting or abdominal pain? NO  Have you been exposed to anyone who has tested positive for COVID-19? NO  Have you or anyone who lives with you traveled within the last month? NO 

## 2019-03-06 ENCOUNTER — Encounter (INDEPENDENT_AMBULATORY_CARE_PROVIDER_SITE_OTHER): Payer: Self-pay | Admitting: Orthopaedic Surgery

## 2019-03-06 ENCOUNTER — Ambulatory Visit (INDEPENDENT_AMBULATORY_CARE_PROVIDER_SITE_OTHER): Payer: Medicaid Other | Admitting: Orthopaedic Surgery

## 2019-03-06 ENCOUNTER — Other Ambulatory Visit: Payer: Self-pay

## 2019-03-06 ENCOUNTER — Encounter: Payer: Self-pay | Admitting: Family

## 2019-03-06 ENCOUNTER — Ambulatory Visit (INDEPENDENT_AMBULATORY_CARE_PROVIDER_SITE_OTHER): Payer: Medicaid Other | Admitting: Family

## 2019-03-06 VITALS — BP 129/87 | HR 56 | Temp 97.8°F | Wt 188.0 lb

## 2019-03-06 DIAGNOSIS — M1612 Unilateral primary osteoarthritis, left hip: Secondary | ICD-10-CM | POA: Diagnosis not present

## 2019-03-06 DIAGNOSIS — B182 Chronic viral hepatitis C: Secondary | ICD-10-CM

## 2019-03-06 NOTE — Patient Instructions (Signed)
Nice to meet you.  We will check your blood work today.  Plan for follow up pending blood work results.

## 2019-03-06 NOTE — Assessment & Plan Note (Signed)
Henry Cunningham has re-current Hepatitis C although cannot completely rule out re-infection as he was lost to follow up at the completion of his initial treatment with Epclusa with less than optimal adherence to the regimen. He continues to have Genotype 3 with now viral load of 68,800. I will check for resistance today and plan on treatment with either Mavyret or Vosevi.
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
## 2019-03-06 NOTE — Progress Notes (Signed)
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  The patient is a 53 year old gentleman with a history of hepatitis C.  He reports that it is been under good control for a long period of time.  Our PA did have labs drawn last week.  The patient does report that he has an appointment with infectious disease clinic today.  He reports debilitating and severe left hip pain.  It is waking up at night.  It has been getting worse for years now.  He ambulates with assistive device.  On exam his right hip exam is normal but his left hip essentially has almost no internal and external rotation with severe pain at the attempts of rotation.  I did review x-rays of his pelvis and left hip and it does show complete loss of the joint space on the left side.  His right hip appears normal with the left side has significant particular osteophytes and flattening of the femoral head and complete loss of the joint space.  I shared with the patient his x-rays as well.  At this point I did show him a hip model explained in detail what hip replacement surgery involves.  My biggest concern is that his hepatitis C parameters have risen significantly and this needs to be certainly under better control before in considering hip replacement at all because of the infection risk to the patient is heightened in an immunocompromise state.  There is certainly risk to the surgical staff as well if someone is viral load is elevated.  I do feel that this needs get a better control before scheduling him for the surgery.  Also in light of the recent Covid 19 pandemic, elective surgeries are being delayed for at least a month.  I would like to see him back in 4 weeks for follow-up.  All question concerns were answered and addressed.  I will defer to the infectious disease specialist as to the treatment for his hepatitis  C.

## 2019-03-07 ENCOUNTER — Encounter: Payer: Medicaid Other | Admitting: Internal Medicine

## 2019-03-16 LAB — HCV VIRAL RNA GEN3 NS5A DRUG RESIST

## 2019-03-19 ENCOUNTER — Telehealth: Payer: Self-pay | Admitting: Pharmacy Technician

## 2019-03-19 ENCOUNTER — Telehealth: Payer: Self-pay | Admitting: Pharmacist

## 2019-03-19 ENCOUNTER — Encounter: Payer: Self-pay | Admitting: Pharmacy Technician

## 2019-03-19 ENCOUNTER — Other Ambulatory Visit: Payer: Self-pay | Admitting: Pharmacist

## 2019-03-19 DIAGNOSIS — B182 Chronic viral hepatitis C: Secondary | ICD-10-CM

## 2019-03-19 MED ORDER — SOFOSBUV-VELPATASV-VOXILAPREV 400-100-100 MG PO TABS: 1.0000 | ORAL_TABLET | Freq: Every day | ORAL | 2 refills | Status: DC

## 2019-03-19 NOTE — Telephone Encounter (Signed)
[facility] Patient Advocate Encounter   Received notification from Henry Cunningham Henry Cunningham that prior authorization for Henry Cunningham is required.   PA submitted on 03/19/2019 Key [id number] W Status is pending    [facility] will continue to follow.   Henry Cunningham. Henry Cunningham [place] Patient [facility] for Infectious Disease Phone: [phone] Fax:  [phone]

## 2019-03-19 NOTE — Progress Notes (Signed)
Sent Vosevi in to [facility] for retreatment of patient's Hepatitis C infection.

## 2019-03-19 NOTE — Telephone Encounter (Signed)
[facility] Patient Advocate Encounter  Prior Authorization for Henry Cunningham has been approved for 8 weeks.  PA# [id number] W Effective dates: 03/19/2019 through 05/18/2019  Patients co-pay is $3.00.   [facility] will continue to follow as there will need to be an additional PA submitted for the final 4 weeks.  Henry Cunningham. Henry Cunningham Henry Cunningham Patient [facility] for Infectious Disease Phone: [phone] Fax:  [phone]

## 2019-03-19 NOTE — Telephone Encounter (Signed)
Patient is approved to receive Vosevi x 12 weeks for chronic Hepatitis C infection re-treatment. Counseled patient to take Vosevi daily with food. Encouraged patient not to miss any doses and explained how their chance of cure could go down with each dose missed, especially important to not miss any doses the 2nd time around. Counseled patient on what to do if dose is missed - if it is closer to the missed dose take immediately; if closer to next dose skip dose and take the next dose at the usual time.   Counseled patient on common symptoms including headache, fatigue, and nausea and that the symptoms normally decrease with time. I reviewed patient medications and found no drug interactions. Discussed with patient that there are several drug interactions including acid suppressants. Instructed patient to call clinic if he wishes to start a new medication during course of therapy. Also advised patient to call if he experiences any side effects. Patient will follow-up with me in the pharmacy clinic on 5/11.

## 2019-04-05 ENCOUNTER — Ambulatory Visit (INDEPENDENT_AMBULATORY_CARE_PROVIDER_SITE_OTHER): Payer: Medicaid Other | Admitting: Orthopaedic Surgery

## 2019-04-09 ENCOUNTER — Ambulatory Visit (INDEPENDENT_AMBULATORY_CARE_PROVIDER_SITE_OTHER): Payer: Medicaid Other | Admitting: Orthopaedic Surgery

## 2019-04-09 ENCOUNTER — Other Ambulatory Visit: Payer: Self-pay

## 2019-04-09 DIAGNOSIS — M87052 Idiopathic aseptic necrosis of left femur: Secondary | ICD-10-CM | POA: Insufficient documentation

## 2019-04-09 DIAGNOSIS — M1612 Unilateral primary osteoarthritis, left hip: Secondary | ICD-10-CM | POA: Insufficient documentation

## 2019-04-09 NOTE — Progress Notes (Signed)
The patient is well-known to me.  He has debilitating left hip pain but no known osteonecrosis of the left hip and superimposed osteoarthritis.  He is ambulate with a cane and has had some falls.  He is in desperate need of hip replacement.  I agree with this as well based on his radiographic findings and his clinical exam findings.  However he does have hepatitis C and has been lost to follow-up.  Recent labs showed an increase in his hepatitis C viral load.  Since then he has been seen by the infectious disease clinic and is now on medications to treat his hepatitis C.  He actually does have a follow-up with them on May 11.  He does understand that we are unable to schedule him for a total hip arthroplasty for the left hip as of yet given the coronavirus pandemic and we hold on elective cases.  I feel it is also prudent to wait at least he has his follow-up appointment with infectious disease so then we can get a better idea of how he is doing this so we can safely schedule his case.  He would still be considered somewhat immune compromised so will be as safe as possible given the COVID-19 worries.  He understands this as well.  All question concerns were answered addressed.  Examination of his left hip still shows severe pain with any attempts of rotation of the hip.  When we do see him back in 3 weeks from now he will have had his infectious disease follow-up appointment and we can have a better idea of when we can schedule him for a left hip replacement.

## 2019-04-23 ENCOUNTER — Ambulatory Visit: Payer: Medicaid Other | Admitting: Pharmacist

## 2019-04-26 ENCOUNTER — Telehealth: Payer: Self-pay | Admitting: Pharmacy Technician

## 2019-04-26 NOTE — Telephone Encounter (Addendum)
Mr. Macken has been scheduled for a pharmacy clinic 05/03/2019.  He will need to get labs required to get approval for final/3rd month of Vosevi.  Venida Jarvis. Nadara Mustard [place] Patient [facility] for Infectious Disease Phone: [phone] Fax:  [phone]

## 2019-04-30 ENCOUNTER — Ambulatory Visit: Payer: Self-pay | Admitting: Orthopaedic Surgery

## 2019-05-03 ENCOUNTER — Ambulatory Visit: Payer: Medicaid Other | Admitting: Pharmacist

## 2019-05-08 ENCOUNTER — Ambulatory Visit (INDEPENDENT_AMBULATORY_CARE_PROVIDER_SITE_OTHER): Payer: Medicaid Other | Admitting: Pharmacist

## 2019-05-08 ENCOUNTER — Other Ambulatory Visit: Payer: Self-pay

## 2019-05-08 DIAGNOSIS — B182 Chronic viral hepatitis C: Secondary | ICD-10-CM
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
## 2019-05-12 LAB — HEPATITIS C RNA QUANTITATIVE
HCV Quantitative Log: <1.18 Log IU/mL
HCV RNA, PCR, QN: <15 IU/mL

## 2019-05-14 NOTE — Telephone Encounter (Addendum)
[facility] Patient Advocate Encounter   Received notification from [place] that prior authorization for Vosevi is required for the final 4 weeks of treatment.   PA submitted and approved on 05/14/2019 Key [id number] W Status is approved until 06/13/2019 for a total of 28 tablets.  Will coordinate with [facility] to have the medication shipped to the patient's home address. Called the patient and let him know of the approval and that the medication should arrive 06/02.    Bartholomew Crews, CPhT Specialty Pharmacy Patient [facility] for Infectious Disease Phone: [phone] Fax: [phone] 05/14/2019 9:29 AM

## 2019-05-15 ENCOUNTER — Ambulatory Visit (INDEPENDENT_AMBULATORY_CARE_PROVIDER_SITE_OTHER): Payer: Medicaid Other | Admitting: Primary Care

## 2019-05-16 ENCOUNTER — Ambulatory Visit (INDEPENDENT_AMBULATORY_CARE_PROVIDER_SITE_OTHER): Payer: Medicaid Other | Admitting: Primary Care

## 2019-06-19 ENCOUNTER — Ambulatory Visit: Payer: Medicaid Other | Admitting: Family

## 2020-01-14 DIAGNOSIS — F329 Major depressive disorder, single episode, unspecified: Secondary | ICD-10-CM | POA: Diagnosis not present

## 2020-01-14 DIAGNOSIS — R4020 Unspecified coma: Secondary | ICD-10-CM | POA: Diagnosis not present

## 2020-01-14 DIAGNOSIS — R0689 Other abnormalities of breathing: Secondary | ICD-10-CM | POA: Diagnosis not present

## 2020-01-14 DIAGNOSIS — R52 Pain, unspecified: Secondary | ICD-10-CM | POA: Diagnosis not present

## 2020-01-14 DIAGNOSIS — R404 Transient alteration of awareness: Secondary | ICD-10-CM | POA: Diagnosis not present

## 2020-11-05 DIAGNOSIS — F329 Major depressive disorder, single episode, unspecified: Secondary | ICD-10-CM | POA: Diagnosis not present

## 2020-11-13 ENCOUNTER — Ambulatory Visit (INDEPENDENT_AMBULATORY_CARE_PROVIDER_SITE_OTHER): Payer: Medicaid Other | Admitting: Primary Care

## 2020-12-23 DIAGNOSIS — Z1152 Encounter for screening for COVID-19: Secondary | ICD-10-CM | POA: Diagnosis not present

## 2020-12-30 ENCOUNTER — Ambulatory Visit (INDEPENDENT_AMBULATORY_CARE_PROVIDER_SITE_OTHER): Payer: Medicaid Other | Admitting: Primary Care

## 2020-12-30 ENCOUNTER — Other Ambulatory Visit: Payer: Self-pay

## 2020-12-30 ENCOUNTER — Encounter (INDEPENDENT_AMBULATORY_CARE_PROVIDER_SITE_OTHER): Payer: Self-pay | Admitting: Primary Care

## 2020-12-30 ENCOUNTER — Telehealth (INDEPENDENT_AMBULATORY_CARE_PROVIDER_SITE_OTHER): Payer: Medicaid Other | Admitting: Primary Care

## 2020-12-30 DIAGNOSIS — U071 COVID-19: Secondary | ICD-10-CM
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
## 2021-04-23 DIAGNOSIS — Z20822 Contact with and (suspected) exposure to covid-19: Secondary | ICD-10-CM | POA: Diagnosis not present

## 2021-11-12 DIAGNOSIS — Z419 Encounter for procedure for purposes other than remedying health state, unspecified: Secondary | ICD-10-CM | POA: Diagnosis not present

## 2021-12-13 DIAGNOSIS — Z419 Encounter for procedure for purposes other than remedying health state, unspecified: Secondary | ICD-10-CM | POA: Diagnosis not present

## 2022-01-13 DIAGNOSIS — Z419 Encounter for procedure for purposes other than remedying health state, unspecified: Secondary | ICD-10-CM | POA: Diagnosis not present

## 2022-02-10 DIAGNOSIS — Z419 Encounter for procedure for purposes other than remedying health state, unspecified: Secondary | ICD-10-CM | POA: Diagnosis not present

## 2022-03-13 DIAGNOSIS — Z419 Encounter for procedure for purposes other than remedying health state, unspecified: Secondary | ICD-10-CM | POA: Diagnosis not present

## 2022-04-12 DIAGNOSIS — Z419 Encounter for procedure for purposes other than remedying health state, unspecified: Secondary | ICD-10-CM | POA: Diagnosis not present

## 2022-05-13 DIAGNOSIS — Z419 Encounter for procedure for purposes other than remedying health state, unspecified: Secondary | ICD-10-CM | POA: Diagnosis not present

## 2022-05-14 ENCOUNTER — Encounter: Payer: Self-pay | Admitting: *Deleted

## 2022-05-14 DIAGNOSIS — Z139 Encounter for screening, unspecified: Secondary | ICD-10-CM

## 2022-05-14 LAB — GLUCOSE, POCT (MANUAL RESULT ENTRY): POC Glucose: 128 mg/dl — AB (ref 70–99)
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
## 2022-05-17 ENCOUNTER — Encounter: Payer: Self-pay | Admitting: *Deleted
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
## 2022-05-25 ENCOUNTER — Encounter (INDEPENDENT_AMBULATORY_CARE_PROVIDER_SITE_OTHER): Payer: Medicaid Other | Admitting: Primary Care

## 2022-06-04 ENCOUNTER — Encounter: Payer: Self-pay | Admitting: *Deleted
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
## 2022-06-12 DIAGNOSIS — Z419 Encounter for procedure for purposes other than remedying health state, unspecified: Secondary | ICD-10-CM | POA: Diagnosis not present

## 2022-07-12 ENCOUNTER — Encounter: Payer: Self-pay | Admitting: *Deleted
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
## 2022-07-13 DIAGNOSIS — Z419 Encounter for procedure for purposes other than remedying health state, unspecified: Secondary | ICD-10-CM | POA: Diagnosis not present

## 2022-07-19 ENCOUNTER — Encounter: Payer: Self-pay | Admitting: *Deleted

## 2022-07-19 DIAGNOSIS — Z139 Encounter for screening, unspecified: Secondary | ICD-10-CM

## 2022-07-19 LAB — GLUCOSE, POCT (MANUAL RESULT ENTRY): POC Glucose: 111 mg/dl — AB (ref 70–99)
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
## 2022-07-21 ENCOUNTER — Encounter: Payer: Self-pay | Admitting: *Deleted
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
## 2022-07-27 ENCOUNTER — Encounter (INDEPENDENT_AMBULATORY_CARE_PROVIDER_SITE_OTHER): Payer: Self-pay | Admitting: Primary Care

## 2022-07-27 ENCOUNTER — Ambulatory Visit (INDEPENDENT_AMBULATORY_CARE_PROVIDER_SITE_OTHER): Payer: Medicaid Other | Admitting: Primary Care

## 2022-07-27 VITALS — BP 137/87 | HR 71 | Temp 98.0°F | Ht 68.0 in | Wt 175.0 lb

## 2022-07-27 DIAGNOSIS — M205X1 Other deformities of toe(s) (acquired), right foot: Secondary | ICD-10-CM

## 2022-07-27 DIAGNOSIS — Z0000 Encounter for general adult medical examination without abnormal findings: Secondary | ICD-10-CM | POA: Diagnosis not present

## 2022-07-27 DIAGNOSIS — Z1211 Encounter for screening for malignant neoplasm of colon: Secondary | ICD-10-CM

## 2022-07-27 DIAGNOSIS — Z7689 Persons encountering health services in other specified circumstances: Secondary | ICD-10-CM | POA: Diagnosis not present

## 2022-07-27 DIAGNOSIS — M205X2 Other deformities of toe(s) (acquired), left foot: Secondary | ICD-10-CM | POA: Diagnosis not present

## 2022-07-27 DIAGNOSIS — R030 Elevated blood-pressure reading, without diagnosis of hypertension: Secondary | ICD-10-CM | POA: Diagnosis not present

## 2022-07-27 DIAGNOSIS — Z13220 Encounter for screening for lipoid disorders: Secondary | ICD-10-CM
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
## 2022-07-27 NOTE — Patient Instructions (Signed)
Preventing Hypertension Hypertension, also called high blood pressure, is when the force of blood pumping through the arteries is too strong. Arteries are blood vessels that carry blood from the heart throughout the body. Often, hypertension does not cause symptoms until blood pressure is very high. It is important to have your blood pressure checked regularly. Diet and lifestyle changes can help you prevent hypertension, and they may make you feel better overall and improve your quality of life. If you already have hypertension, you may control it with diet and lifestyle changes, as well as with medicine. How can this condition affect me? Over time, hypertension can damage the arteries and decrease blood flow to important parts of the body, including the brain, heart, and kidneys. By keeping your blood pressure in a healthy range, you can help prevent complications like heart attack, heart failure, stroke, kidney failure, and vascular dementia. What can increase my risk? An unhealthy diet and a lack of physical activity can make you more likely to develop high blood pressure. Some other risk factors include: Age. The risk increases with age. Having family members who have had high blood pressure. Having certain health conditions, such as thyroid problems. Being overweight or obese. Drinking too much alcohol or caffeine. Having too much fat, sugar, calories, or salt (sodium) in your diet. Smoking or using illegal drugs. Taking certain medicines, such as antidepressants, decongestants, birth control pills, and NSAIDs, such as ibuprofen. What actions can I take to prevent or manage this condition? Work with your health care provider to make a hypertension prevention plan that works for you. You may be referred for counseling on a healthy diet and physical activity. Follow your plan and keep all follow-up visits. Diet changes Maintain a healthy diet. This includes: Eating less salt (sodium). Ask your  health care provider how much sodium is safe for you to have. The general recommendation is to have less than 1 tsp (2,300 mg) of sodium a day. Do not add salt to your food. Choose low-sodium options when grocery shopping and eating out. Limiting fats in your diet. You can do this by eating low-fat or fat-free dairy products and by eating less red meat. Eating more fruits, vegetables, and whole grains. Make a goal to eat: 1-2 cups of fresh fruits and vegetables each day. 3-4 servings of whole grains each day. Avoiding foods and beverages that have added sugars. Eating fish that contain healthy fats (omega-3 fatty acids), such as mackerel or salmon. If you need help putting together a healthy eating plan, try the DASH diet. This diet is high in fruits, vegetables, and whole grains. It is low in sodium, red meat, and added sugars. DASH stands for Dietary Approaches to Stop Hypertension. Lifestyle changes  Lose weight if you are overweight. Losing just 3-5% of your body weight can help prevent or control hypertension. For example, if your present weight is 200 lb (91 kg), a loss of 3-5% of your weight means losing 6-10 lb (2.7-4.5 kg). Ask your health care provider to help you with a diet and exercise plan to safely lose weight. Get enough exercise. Do at least 150 minutes of moderate-intensity exercise each week. You could do this in short exercise sessions several times a day, or you could do longer exercise sessions a few times a week. For example, you could take a brisk 10-minute walk or bike ride, 3 times a day, for 5 days a week. Find ways to reduce stress, such as exercising, meditating, listening to   music, or taking a yoga class. If you need help reducing stress, ask your health care provider. Do not use any products that contain nicotine or tobacco. These products include cigarettes, chewing tobacco, and vaping devices, such as e-cigarettes. Chemicals in tobacco and nicotine products raise your  blood pressure each time you use them. If you need help quitting, ask your health care provider. Learn how to check your blood pressure at home. Make sure that you know your personal target blood pressure, as told by your health care provider. Try to sleep 7-9 hours per night. Alcohol use Do not drink alcohol if: Your health care provider tells you not to drink. You are pregnant, may be pregnant, or are planning to become pregnant. If you drink alcohol: Limit how much you have to: 0-1 drink a day for women. 0-2 drinks a day for men. Know how much alcohol is in your drink. In the [place] one drink equals one 12 oz bottle of beer (355 mL), one 5 oz glass of wine (148 mL), or one 1 oz glass of hard liquor (44 mL). Medicines In addition to diet and lifestyle changes, your health care provider may recommend medicines to help lower your blood pressure. In general: You may need to try a few different medicines to find what works best for you. You may need to take more than one medicine. Take over-the-counter and prescription medicines only as told by your health care provider. Questions to ask your health care provider What is my blood pressure goal? How can I lower my risk for high blood pressure? How should I monitor my blood pressure at home? Where to find support Your health care provider can help you prevent hypertension and help you keep your blood pressure at a healthy level. Your local hospital or your community may also provide support services and prevention programs. The American Heart Association offers an online support network at supportnetwork.heart.org Where to find more information Learn more about hypertension from: [place], Lung, and [place]: https://wilson-eaton.com/ Centers for Disease Control and Prevention: http://www.wolf.info/ American Academy of Family Physicians: familydoctor.org Learn more about the DASH diet from: [place], Lung, and [place]:  https://wilson-eaton.com/ Contact a health care provider if: You think you are having a reaction to medicines you have taken. You have recurrent headaches or feel dizzy. You have swelling in your ankles. You have trouble with your vision. Get help right away if: You have sudden, severe chest, back, or abdominal pain or discomfort. You have shortness of breath. You have a sudden, severe headache. These symptoms may be an emergency. Get help right away. Call 911. Do not wait to see if the symptoms will go away. Do not drive yourself to the hospital. Summary Hypertension often does not cause any symptoms until blood pressure is very high. It is important to get your blood pressure checked regularly. Diet and lifestyle changes are important steps in preventing hypertension. By keeping your blood pressure in a healthy range, you may prevent complications like heart attack, heart failure, stroke, and kidney failure. Work with your health care provider to make a hypertension prevention plan that works for you. This information is not intended to replace advice given to you by your health care provider. Make sure you discuss any questions you have with your health care provider. Document Revised: 09/17/2021 Document Reviewed: 09/17/2021 Elsevier Patient Education  [place].

## 2022-08-04 ENCOUNTER — Encounter: Payer: Self-pay | Admitting: *Deleted
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
## 2022-08-09 ENCOUNTER — Ambulatory Visit (INDEPENDENT_AMBULATORY_CARE_PROVIDER_SITE_OTHER): Payer: Medicaid Other | Admitting: Podiatry

## 2022-08-09 ENCOUNTER — Ambulatory Visit (INDEPENDENT_AMBULATORY_CARE_PROVIDER_SITE_OTHER): Payer: Medicaid Other

## 2022-08-09 ENCOUNTER — Encounter: Payer: Self-pay | Admitting: *Deleted

## 2022-08-09 DIAGNOSIS — B351 Tinea unguium: Secondary | ICD-10-CM | POA: Diagnosis not present

## 2022-08-09 DIAGNOSIS — M216X9 Other acquired deformities of unspecified foot: Secondary | ICD-10-CM

## 2022-08-09 DIAGNOSIS — M216X1 Other acquired deformities of right foot: Secondary | ICD-10-CM

## 2022-08-09 DIAGNOSIS — M216X2 Other acquired deformities of left foot: Secondary | ICD-10-CM

## 2022-08-09 DIAGNOSIS — M722 Plantar fascial fibromatosis: Secondary | ICD-10-CM | POA: Diagnosis not present

## 2022-08-09 MED ORDER — TRIAMCINOLONE ACETONIDE 10 MG/ML IJ SUSP
10.0000 mg | Freq: Once | INTRAMUSCULAR | Status: AC
  Administered 2022-08-09: 10 mg
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
## 2022-08-13 DIAGNOSIS — Z419 Encounter for procedure for purposes other than remedying health state, unspecified: Secondary | ICD-10-CM | POA: Diagnosis not present

## 2022-09-12 DIAGNOSIS — Z419 Encounter for procedure for purposes other than remedying health state, unspecified: Secondary | ICD-10-CM | POA: Diagnosis not present
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
## 2022-09-28 ENCOUNTER — Other Ambulatory Visit: Payer: Self-pay

## 2022-09-28 ENCOUNTER — Ambulatory Visit (AMBULATORY_SURGERY_CENTER): Payer: Self-pay

## 2022-09-28 VITALS — Ht 68.0 in | Wt 169.0 lb

## 2022-09-28 DIAGNOSIS — Z1211 Encounter for screening for malignant neoplasm of colon: Secondary | ICD-10-CM

## 2022-09-28 MED ORDER — PEG 3350-KCL-NA BICARB-NACL 420 G PO SOLR
4000.0000 mL | Freq: Once | ORAL | 0 refills | Status: AC
  Filled 2022-09-28: qty 4000, 1d supply, fill #0

## 2022-09-28 NOTE — Progress Notes (Signed)
No egg or soy allergy known to patient  No issues known to pt with past sedation with any surgeries or procedures  Patient denies ever being told they had issues or difficulty with intubation   No FH of Malignant Hyperthermia  Pt is not on diet pills  Pt is not on  home 02   Pt denies taking blood thinners Pt denies issues with constipation   No A fib or A flutter Have any cardiac testing pending--no  Pt instructed to use Singlecare.com or GoodRx for a price reduction on prep   Patient's chart reviewed by Osvaldo Angst CNRA prior to previsit and patient appropriate for the [place].  Previsit completed and red dot placed by patient's name on their procedure day (on provider's schedule).

## 2022-10-04 ENCOUNTER — Other Ambulatory Visit: Payer: Self-pay
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
## 2022-10-13 DIAGNOSIS — Z419 Encounter for procedure for purposes other than remedying health state, unspecified: Secondary | ICD-10-CM | POA: Diagnosis not present
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
## 2022-10-15 ENCOUNTER — Telehealth: Payer: Self-pay | Admitting: Gastroenterology

## 2022-10-15 NOTE — Telephone Encounter (Signed)
Patient called to inform pharmacy change to [facility] on [address]

## 2022-10-18 ENCOUNTER — Telehealth: Payer: Self-pay | Admitting: Gastroenterology

## 2022-10-18 ENCOUNTER — Encounter: Payer: Medicaid Other | Admitting: Gastroenterology

## 2022-10-18 NOTE — Telephone Encounter (Signed)
Good Morning Dr. Bryan Lemma,  I tried to call this patient today at 8:15am however, the line goes busy after a few rings I could not leave a VM   I will NO SHOW this patient Medicaid

## 2022-10-18 NOTE — Telephone Encounter (Signed)
Agreed. I have no messages regarding cancellation or intolerance to prep.
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
## 2022-10-25 ENCOUNTER — Encounter: Payer: Self-pay | Admitting: *Deleted
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
## 2022-10-27 ENCOUNTER — Encounter (INDEPENDENT_AMBULATORY_CARE_PROVIDER_SITE_OTHER): Payer: Medicaid Other | Admitting: Primary Care

## 2022-10-27 ENCOUNTER — Encounter: Payer: Self-pay | Admitting: *Deleted

## 2022-10-27 NOTE — Telephone Encounter (Signed)
Good afternoon Dr.Cirigliano,  I received a phone call from Desma Paganini RN. She is a Scientist, research (physical sciences) with [employer]. She called to reschedule patient for his missed colonoscopy. She states the patient is homeless and [facility] pays for his visits, they had a place for him to stay the night before his procedure but payment fell through and the patient had no where to stay or had no one to bring him to procedure. I advised her when we rescheduled someone would need to stay with him during the procedure and then drive him. The patient currently does not have a phone so he is unable to get phone calls. She was also going to make notes of our conversation on Epic. I just wanted to let you know what happened with the patient as to why he missed his procedure.  Thanks.
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
## 2022-11-03 ENCOUNTER — Encounter: Payer: Self-pay | Admitting: *Deleted
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
## 2022-11-12 DIAGNOSIS — Z419 Encounter for procedure for purposes other than remedying health state, unspecified: Secondary | ICD-10-CM | POA: Diagnosis not present

## 2022-11-15 ENCOUNTER — Encounter: Payer: Self-pay | Admitting: *Deleted
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
## 2022-12-01 ENCOUNTER — Encounter: Payer: Self-pay | Admitting: *Deleted
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
## 2022-12-08 ENCOUNTER — Ambulatory Visit (INDEPENDENT_AMBULATORY_CARE_PROVIDER_SITE_OTHER): Payer: Medicaid Other | Admitting: Primary Care

## 2022-12-22 ENCOUNTER — Encounter: Payer: Self-pay | Admitting: *Deleted
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
## 2022-12-24 ENCOUNTER — Ambulatory Visit (AMBULATORY_SURGERY_CENTER): Payer: Self-pay

## 2022-12-24 ENCOUNTER — Encounter: Payer: Self-pay | Admitting: *Deleted

## 2022-12-24 VITALS — Ht 68.0 in | Wt 173.0 lb

## 2022-12-24 DIAGNOSIS — Z800 Family history of malignant neoplasm of digestive organs: Secondary | ICD-10-CM

## 2022-12-24 DIAGNOSIS — Z1211 Encounter for screening for malignant neoplasm of colon: Secondary | ICD-10-CM

## 2022-12-24 MED ORDER — PLENVU 140 G PO SOLR: 1.0000 | ORAL | 0 refills | Status: DC
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
## 2022-12-24 NOTE — Progress Notes (Signed)
No egg or soy allergy known to patient;  No issues known to pt with past sedation with any surgeries or procedures; Patient denies ever being told they had issues or difficulty with intubation  No FH of Malignant Hyperthermia Pt is not on diet pills Pt is not on home 02  Pt is not on blood thinners  Pt denies issues with constipation;  No A fib or A flutter Have any cardiac testing pending--NO Pt instructed to use Singlecare.com or GoodRx for a price reduction on prep  Insurance verified during [place] appt=[place] Medicaid  Patient's chart reviewed by Osvaldo Angst CRNA prior to previsit and patient appropriate for the [place].  Previsit completed and red dot placed by patient's name on their procedure day (on provider's schedule).

## 2022-12-29 ENCOUNTER — Encounter: Payer: Self-pay | Admitting: *Deleted
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
## 2023-01-07 ENCOUNTER — Encounter: Payer: Medicaid Other | Admitting: Gastroenterology

## 2023-01-12 ENCOUNTER — Encounter: Payer: Self-pay | Admitting: *Deleted
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
## 2023-01-17 ENCOUNTER — Encounter: Payer: Self-pay | Admitting: *Deleted
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
## 2023-01-20 ENCOUNTER — Telehealth: Payer: Self-pay | Admitting: Pediatric Intensive Care

## 2023-01-20 NOTE — Telephone Encounter (Signed)
Will call client back later this afternoon.

## 2023-01-24 ENCOUNTER — Telehealth: Payer: Self-pay | Admitting: Pediatric Intensive Care

## 2023-01-24 NOTE — Telephone Encounter (Signed)
Call to client to discuss rides and hotel for upcoming appointment for colonoscopy at [facility] on 01/27/23. Client ID x 2 identifiers for call. Client states he needs ride to hotel from home on 2/14, from hotel to/from [place] on 2/15 and to home on Am of 2/16/ Client has used [facility] through the [employer] in the past. CN states that he can call back at [phone] if any questions regarding rides. Lisette Abu BSN RN CCNP [phone].

## 2023-01-27 ENCOUNTER — Encounter: Payer: Self-pay | Admitting: Gastroenterology

## 2023-01-27 ENCOUNTER — Ambulatory Visit (AMBULATORY_SURGERY_CENTER): Payer: Commercial Managed Care - HMO | Admitting: Gastroenterology

## 2023-01-27 VITALS — BP 127/70 | HR 59 | Temp 98.4°F | Resp 19 | Ht 68.0 in | Wt 173.0 lb

## 2023-01-27 DIAGNOSIS — K642 Third degree hemorrhoids: Secondary | ICD-10-CM

## 2023-01-27 DIAGNOSIS — Z1211 Encounter for screening for malignant neoplasm of colon: Secondary | ICD-10-CM | POA: Diagnosis not present

## 2023-01-27 DIAGNOSIS — D123 Benign neoplasm of transverse colon: Secondary | ICD-10-CM

## 2023-01-27 MED ORDER — SODIUM CHLORIDE 0.9 % IV SOLN: 500.0000 mL | Freq: Once | INTRAVENOUS | Status: DC
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
## 2023-01-27 NOTE — Progress Notes (Signed)
To pacu, VSS. Report to Rn.tb 

## 2023-01-27 NOTE — Op Note (Signed)
[place] Patient Name: Henry Cunningham Procedure Date: 01/27/2023 11:26 AM MRN: [id number] Endoscopist: Gerrit Heck , MD, [id number] Age: 57 Referring MD:  Date of Birth: 1966-06-26 Gender: Male Account #: [id number] Procedure:                Colonoscopy Indications:              Screening for colorectal malignant neoplasm, This                            is the patient's first colonoscopy Medicines:                Monitored Anesthesia Care Procedure:                Pre-Anesthesia Assessment:                           - Prior to the procedure, a History and Physical                            was performed, and patient medications and                            allergies were reviewed. The patient's tolerance of                            previous anesthesia was also reviewed. The risks                            and benefits of the procedure and the sedation                            options and risks were discussed with the patient.                            All questions were answered, and informed consent                            was obtained. Prior Anticoagulants: The patient has                            taken no anticoagulant or antiplatelet agents. ASA                            Grade Assessment: II - A patient with mild systemic                            disease. After reviewing the risks and benefits,                            the patient was deemed in satisfactory condition to                            undergo the procedure.  After obtaining informed consent, the colonoscope                            was passed under direct vision. Throughout the                            procedure, the patient's blood pressure, pulse, and                            oxygen saturations were monitored continuously. The                            Olympus CF-HQ190L [id number]) Colonoscope was                            introduced through the anus  and advanced to the the                            terminal ileum. The colonoscopy was performed                            without difficulty. The patient tolerated the                            procedure well. The quality of the bowel                            preparation was good. The terminal ileum, ileocecal                            valve, appendiceal orifice, and rectum were                            photographed. Scope In: 1:08:30 PM Scope Out: 1:20:52 PM Scope Withdrawal Time: 0 hours 9 minutes 32 seconds  Total Procedure Duration: 0 hours 12 minutes 22 seconds  Findings:                 Hemorrhoids were found on perianal exam.                           A 3 mm polyp was found in the transverse colon. The                            polyp was sessile. The polyp was removed with a                            cold snare. Resection and retrieval were complete.                            Estimated blood loss was minimal.                           Non-bleeding external and internal hemorrhoids were  found during retroflexion. The hemorrhoids were                            Grade III (internal hemorrhoids that prolapse but                            require manual reduction).                           The terminal ileum appeared normal. Complications:            No immediate complications. Estimated Blood Loss:     Estimated blood loss was minimal. Impression:               - Hemorrhoids found on perianal exam.                           - One 3 mm polyp in the transverse colon, removed                            with a cold snare. Resected and retrieved.                           - Non-bleeding external and internal hemorrhoids.                           - The examined portion of the ileum was normal. Recommendation:           - Patient has a contact number available for                            emergencies. The signs and symptoms of potential                             delayed complications were discussed with the                            patient. Return to normal activities tomorrow.                            Written discharge instructions were provided to the                            patient.                           - Resume previous diet.                           - Continue present medications.                           - Await pathology results.                           - Repeat colonoscopy for surveillance based on  pathology results.                           - Return to GI office PRN.                           - Can consider referral to a colo-rectal surgeon                            for evaluation of internal/external hemorrhoids,                            particularly if symptomatic. Gerrit Heck, MD 01/27/2023 1:30:04 PM

## 2023-01-27 NOTE — Patient Instructions (Addendum)
Handout on hemorrhoids and polyps given to patient.  Await pathology results. Resume previous diet and continue present medications. Can consider referral to a colo-rectal surgeon for evaluation of internal/external hemorrhoids, particularly symptomatic.  Repeat colonoscopy for surveillance based off of pathology results.   YOU HAD AN ENDOSCOPIC PROCEDURE TODAY AT [facility]:   Refer to the procedure report that was given to you for any specific questions about what was found during the examination.  If the procedure report does not answer your questions, please call your gastroenterologist to clarify.  If you requested that your care partner not be given the details of your procedure findings, then the procedure report has been included in a sealed envelope for you to review at your convenience later.  YOU SHOULD EXPECT: Some feelings of bloating in the abdomen. Passage of more gas than usual.  Walking can help get rid of the air that was put into your GI tract during the procedure and reduce the bloating. If you had a lower endoscopy (such as a colonoscopy or flexible sigmoidoscopy) you may notice spotting of blood in your stool or on the toilet paper. If you underwent a bowel prep for your procedure, you may not have a normal bowel movement for a few days.  Please Note:  You might notice some irritation and congestion in your nose or some drainage.  This is from the oxygen used during your procedure.  There is no need for concern and it should clear up in a day or so.  SYMPTOMS TO REPORT IMMEDIATELY:  Following lower endoscopy (colonoscopy or flexible sigmoidoscopy):  Excessive amounts of blood in the stool  Significant tenderness or worsening of abdominal pains  Swelling of the abdomen that is new, acute  Fever of 100F or higher  For urgent or emergent issues, a gastroenterologist can be reached at any hour by calling [phone]. Do not use MyChart messaging for  urgent concerns.    DIET:  We do recommend a small meal at first, but then you may proceed to your regular diet.  Drink plenty of fluids but you should avoid alcoholic beverages for 24 hours.  ACTIVITY:  You should plan to take it easy for the rest of today and you should NOT DRIVE or use heavy machinery until tomorrow (because of the sedation medicines used during the test).    FOLLOW UP: Our staff will call the number listed on your records the next business day following your procedure.  We will call around 7:15- 8:00 am to check on you and address any questions or concerns that you may have regarding the information given to you following your procedure. If we do not reach you, we will leave a message.     If any biopsies were taken you will be contacted by phone or by letter within the next 1-3 weeks.  Please call us at [phone] if you have not heard about the biopsies in 3 weeks.    SIGNATURES/CONFIDENTIALITY: You and/or your care partner have signed paperwork which will be entered into your electronic medical record.  These signatures attest to the fact that that the information above on your After Visit Summary has been reviewed and is understood.  Full responsibility of the confidentiality of this discharge information lies with you and/or your care-partner.

## 2023-01-27 NOTE — Progress Notes (Signed)
Pt's states no medical or surgical changes since previsit or office visit. 

## 2023-01-28 ENCOUNTER — Telehealth: Payer: Self-pay

## 2023-01-28 NOTE — Telephone Encounter (Signed)
  Follow up Call-     01/27/2023   11:12 AM  Call back number  Post procedure Call Back phone  # [phone]  Permission to leave phone message Yes     Patient questions:  Do you have a fever, pain , or abdominal swelling? No. Pain Score  0 *  Have you tolerated food without any problems? Yes.    Have you been able to return to your normal activities? Yes.    Do you have any questions about your discharge instructions: Diet   No. Medications  No. Follow up visit  No.  Do you have questions or concerns about your Care? No.  Actions: * If pain score is 4 or above: No action needed, pain <4.

## 2023-02-01 ENCOUNTER — Telehealth: Payer: Self-pay | Admitting: Pediatric Intensive Care

## 2023-02-01 NOTE — Telephone Encounter (Signed)
Call from client- states that he has a podiatry appointment tomorrow and he needs a ride. CN will schedule [facility] but reminds client that he has [employer]EM[employer] through [employer] and he should work with nurse Jan at [facility] to discuss how to set up [employer] rides. CN scheduled ride to [facility] via [employer]. Lisette Abu BSN RN CCNP [phone]

## 2023-02-02 ENCOUNTER — Ambulatory Visit (INDEPENDENT_AMBULATORY_CARE_PROVIDER_SITE_OTHER): Payer: Medicaid Other | Admitting: Podiatry

## 2023-02-02 DIAGNOSIS — M216X9 Other acquired deformities of unspecified foot: Secondary | ICD-10-CM

## 2023-02-02 DIAGNOSIS — M76811 Anterior tibial syndrome, right leg: Secondary | ICD-10-CM

## 2023-02-02 DIAGNOSIS — M216X1 Other acquired deformities of right foot: Secondary | ICD-10-CM | POA: Diagnosis not present

## 2023-02-02 DIAGNOSIS — M21619 Bunion of unspecified foot: Secondary | ICD-10-CM

## 2023-02-02 MED ORDER — TRIAMCINOLONE ACETONIDE 10 MG/ML IJ SUSP
10.0000 mg | Freq: Once | INTRAMUSCULAR | Status: AC
  Administered 2023-02-02: 10 mg

## 2023-02-02 NOTE — Progress Notes (Signed)
Subjective:   Patient ID: Henry Cunningham, male   DOB: 57 y.o.   MRN: [id number]   HPI Patient presents with inflammation of the midfoot right and also painful bunion deformity right with plantar callus that becomes hard for him to walk on.  Patient's health has not changed significantly smokes quarter pack per day   ROS      Objective:  Physical Exam  Neurovascular status unchanged with flatfoot deformity right inflammation midfoot with pain at the anterior tibial insertion right along with structural bunion deformity right with bone formation around the first metatarsal head that becomes tender.  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  He agrees with me and I did though discuss with him the possibility for surgical intervention

## 2023-02-03 ENCOUNTER — Encounter: Payer: Self-pay | Admitting: Gastroenterology

## 2023-02-07 ENCOUNTER — Encounter: Payer: Self-pay | Admitting: *Deleted
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
## 2023-02-09 ENCOUNTER — Ambulatory Visit: Payer: Medicaid Other | Admitting: Podiatry

## 2024-10-10 ENCOUNTER — Telehealth: Admitting: Nurse Practitioner

## 2024-10-10 DIAGNOSIS — M1612 Unilateral primary osteoarthritis, left hip: Secondary | ICD-10-CM

## 2024-10-10 DIAGNOSIS — G8929 Other chronic pain: Secondary | ICD-10-CM | POA: Diagnosis not present

## 2024-10-10 DIAGNOSIS — M21611 Bunion of right foot: Secondary | ICD-10-CM
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
## 2024-11-12 ENCOUNTER — Ambulatory Visit (INDEPENDENT_AMBULATORY_CARE_PROVIDER_SITE_OTHER): Admitting: Primary Care

## 2024-11-12 ENCOUNTER — Encounter: Payer: Self-pay | Admitting: Physical Medicine and Rehabilitation

## 2024-11-13 ENCOUNTER — Other Ambulatory Visit (HOSPITAL_COMMUNITY): Payer: Self-pay

## 2024-11-13 MED ORDER — HYDROCODONE-ACETAMINOPHEN 10-325 MG PO TABS
1.0000 | ORAL_TABLET | ORAL | 0 refills | Status: AC | PRN
  Filled 2024-11-13: qty 30, 5d supply, fill #0

## 2024-11-13 MED ORDER — TIZANIDINE HCL 4 MG PO TABS
4.0000 mg | ORAL_TABLET | Freq: Three times a day (TID) | ORAL | 0 refills | Status: DC | PRN
  Filled 2024-11-13: qty 21, 7d supply, fill #0

## 2024-11-13 MED ORDER — GABAPENTIN 300 MG PO CAPS
300.0000 mg | ORAL_CAPSULE | Freq: Every day | ORAL | 0 refills | Status: DC
  Filled 2024-11-13: qty 7, 7d supply, fill #0

## 2024-11-20 ENCOUNTER — Other Ambulatory Visit (HOSPITAL_COMMUNITY): Payer: Self-pay

## 2024-11-20 MED ORDER — OXYCODONE-ACETAMINOPHEN 5-325 MG PO TABS
1.0000 | ORAL_TABLET | Freq: Four times a day (QID) | ORAL | 0 refills | Status: AC | PRN
  Filled 2024-11-20: qty 20, 5d supply, fill #0

## 2024-11-20 MED ORDER — TIZANIDINE HCL 4 MG PO TABS
4.0000 mg | ORAL_TABLET | Freq: Three times a day (TID) | ORAL | 0 refills | Status: DC | PRN
  Filled 2024-11-20: qty 21, 7d supply, fill #0

## 2024-11-20 MED ORDER — GABAPENTIN 300 MG PO CAPS
300.0000 mg | ORAL_CAPSULE | Freq: Every day | ORAL | 0 refills | Status: DC
  Filled 2024-11-20: qty 7, 7d supply, fill #0

## 2024-11-27 ENCOUNTER — Other Ambulatory Visit (HOSPITAL_COMMUNITY): Payer: Self-pay

## 2024-11-27 MED ORDER — TIZANIDINE HCL 4 MG PO TABS
4.0000 mg | ORAL_TABLET | Freq: Three times a day (TID) | ORAL | 0 refills | Status: DC | PRN
  Filled 2024-11-27: qty 42, 14d supply, fill #0

## 2024-11-27 MED ORDER — GABAPENTIN 300 MG PO CAPS
300.0000 mg | ORAL_CAPSULE | Freq: Every evening | ORAL | 0 refills | Status: DC
  Filled 2024-11-27: qty 14, 14d supply, fill #0

## 2024-11-27 MED ORDER — OXYCODONE-ACETAMINOPHEN 10-325 MG PO TABS
1.0000 | ORAL_TABLET | Freq: Four times a day (QID) | ORAL | 0 refills | Status: DC | PRN
  Filled 2024-11-27: qty 56, 14d supply, fill #0

## 2024-12-19 NOTE — Progress Notes (Signed)
 The patient attended a virtual primary care appt on 10/10/2024, where his bp and labs were not taken due to it being a video visit. During the appt, the pt did not report smoking status, he has a PCP, insurance, and the SDOH screener was not conducted.  A chart review indicates that Ronal Jenkins Houseman, NP is his PCP. There are currently no [facility] visible appts ever with this provider. Appt notes indicates that the pt wishes to f/u with his PCP and encouraged to f/u with the [facility] or [facility] for his acute needs. Chart review shows that he has a no show status for an appt with Rosaline MYRTIS [facility] on 11/12/2024. He has previously seen this provider for his primary care needs, his last appt with this provider was on 07/27/2022. He does not have any upcoming appts with this provider at this time. He has Medicaid for his insurance. He has a history of smoking from 01/27/2023 and no current SDOH needs indicated at this time.   Call Attempt #1: CHW called pt to obtain PCP status. CHW unable to reach pt at both numbers in chart due to them not being in service.   Call Attempt #2: CHW called pt again to obtain PCP status. CHW unable to reach pt at both numbers again due to them being out of service.  Call Attempt #3: CHW called pt at both numbers for third attempt and was unable to reach pt at this time.   CHW sent courtesy letter about PCP status and included Get Care now, [facility], and [facility] calendar in case needed by the pt.   An additional follow up will be done at a later date per the [employer] Teams protocol.

## 2024-12-20 ENCOUNTER — Other Ambulatory Visit (HOSPITAL_COMMUNITY): Payer: Self-pay

## 2024-12-20 MED ORDER — GABAPENTIN 300 MG PO CAPS
300.0000 mg | ORAL_CAPSULE | Freq: Every evening | ORAL | 0 refills | Status: AC
  Filled 2024-12-20: qty 30, 30d supply, fill #0

## 2024-12-20 MED ORDER — NALOXONE HCL 4 MG/0.1ML NA LIQD
1.0000 | NASAL | 4 refills | Status: AC
  Filled 2024-12-20: qty 2, 2d supply, fill #0

## 2024-12-20 MED ORDER — OXYCODONE-ACETAMINOPHEN 10-325 MG PO TABS
1.0000 | ORAL_TABLET | Freq: Four times a day (QID) | ORAL | 0 refills | Status: AC | PRN
  Filled 2024-12-20: qty 56, 14d supply, fill #0

## 2024-12-20 MED ORDER — TIZANIDINE HCL 4 MG PO TABS
4.0000 mg | ORAL_TABLET | Freq: Four times a day (QID) | ORAL | 0 refills | Status: AC | PRN
  Filled 2024-12-20: qty 120, 30d supply, fill #0

## 2024-12-26 ENCOUNTER — Encounter: Attending: Physical Medicine and Rehabilitation | Admitting: Physical Medicine and Rehabilitation
